# Patient Record
Sex: Female | Born: 2000 | Hispanic: Yes | Marital: Single | Smoking: Never smoker
Health system: Southern US, Community
[De-identification: ages and names within clinical notes are randomized; demographics above are authoritative.]

## PROBLEM LIST (undated history)

## (undated) DIAGNOSIS — S060XAA Concussion with loss of consciousness status unknown, initial encounter: Secondary | ICD-10-CM

## (undated) DIAGNOSIS — F319 Bipolar disorder, unspecified: Secondary | ICD-10-CM

## (undated) DIAGNOSIS — F32A Depression, unspecified: Secondary | ICD-10-CM

## (undated) DIAGNOSIS — F419 Anxiety disorder, unspecified: Secondary | ICD-10-CM

## (undated) DIAGNOSIS — S060X9A Concussion with loss of consciousness of unspecified duration, initial encounter: Secondary | ICD-10-CM

## (undated) DIAGNOSIS — F329 Major depressive disorder, single episode, unspecified: Secondary | ICD-10-CM

## (undated) DIAGNOSIS — J45909 Unspecified asthma, uncomplicated: Secondary | ICD-10-CM

---
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
## 2021-01-02 ENCOUNTER — Telehealth (INDEPENDENT_AMBULATORY_CARE_PROVIDER_SITE_OTHER): Payer: No Typology Code available for payment source | Admitting: Child and Adolescent Psychiatry

## 2021-01-02 ENCOUNTER — Encounter: Payer: Self-pay | Admitting: Child and Adolescent Psychiatry

## 2021-01-02 ENCOUNTER — Other Ambulatory Visit: Payer: Self-pay

## 2021-01-02 DIAGNOSIS — F330 Major depressive disorder, recurrent, mild: Secondary | ICD-10-CM | POA: Diagnosis not present

## 2021-01-02 DIAGNOSIS — G4709 Other insomnia: Secondary | ICD-10-CM

## 2021-01-02 DIAGNOSIS — F418 Other specified anxiety disorders: Secondary | ICD-10-CM

## 2021-01-02 DIAGNOSIS — F422 Mixed obsessional thoughts and acts: Secondary | ICD-10-CM | POA: Diagnosis not present

## 2021-01-02 MED ORDER — HYDROXYZINE HCL 50 MG PO TABS: ORAL_TABLET | ORAL | 1 refills | Status: DC

## 2021-01-02 MED ORDER — MIRTAZAPINE 15 MG PO TABS: 15.0000 mg | ORAL_TABLET | Freq: Every day | ORAL | 1 refills | Status: DC

## 2021-01-02 MED ORDER — FLUOXETINE HCL 10 MG PO CAPS: 10.0000 mg | ORAL_CAPSULE | Freq: Every day | ORAL | 1 refills | Status: DC

## 2021-01-02 MED ORDER — HYDROXYZINE HCL 25 MG PO TABS: ORAL_TABLET | ORAL | 1 refills | Status: DC

## 2021-01-02 MED ORDER — PROPRANOLOL HCL 10 MG PO TABS: ORAL_TABLET | ORAL | 1 refills | Status: DC

## 2021-01-02 MED ORDER — FLUOXETINE HCL 40 MG PO CAPS: 40.0000 mg | ORAL_CAPSULE | Freq: Every day | ORAL | 1 refills | Status: DC

## 2021-01-02 NOTE — Progress Notes (Signed)
Virtual Visit via Video Note  I connected with Gina Rogers on 01/02/21 at  1:00 PM EDT by a video enabled telemedicine application and verified that I am speaking with the correct person using two identifiers.  Location: Patient: home Provider: office   I discussed the limitations of evaluation and management by telemedicine and the availability of in person appointments. The patient expressed understanding and agreed to proceed.    I discussed the assessment and treatment plan with the patient. The patient was provided an opportunity to ask questions and all were answered. The patient agreed with the plan and demonstrated an understanding of the instructions.   The patient was advised to call back or seek an in-person evaluation if the symptoms worsen or if the condition fails to improve as anticipated.  I provided 30 minutes of non-face-to-face time during this encounter.   Gina Smalling, MD     [facility] MD/PA/NP OP Progress Note  01/02/21  1:00 PM Gina Rogers  MRN:  [id number]  Chief Complaint: Medication management follow-up for mood, anxiety, OCD and sleeping difficulties.  HPI: This is a 20 year old female in treatment for anxiety, OCD, sleeping difficulties, mood and also has history of trauma and multiple previous psychiatric hospitalizations.   Gina Rogers was seen and evaluated over telemedicine encounter for medication management follow-up.  She was present by herself and was evaluated alone.  Appointment was also attended by rotating nurse practitioner student with the patient's verbal informed consent.  Gina Rogers reports that she believes she is doing better as compared to last appointment however her mood has remained "low".  She rates her mood around 4.5 out of 10, 10 being the best mood.  She reports that her mood improves as soon as she gets out of her home or hangs out with her friends.  She does report that she lacks motivation, and also has poor appetite.  She reports that she  is sleeping fairly well with Remeron, still struggles with tiredness, denies any suicidal thoughts or homicidal thoughts.  She does report anxiety in the context of overthinking about her future and college outcome.  She reports that she continues to work on paperwork regarding school and will hear back from college on December 16 whether she will be able to be enrolled herself in the college.  She reports that she tries to stay in the present rather than overthinking and that has been helpful with her anxiety. Provided refelctive and empathic listening, and validated patient's experience.  She reports that she does hang out with her friends, with her neighbor and is planning to go to [place] to visit her grandparents.  We discussed the option of increasing the fluoxetine to 60 mg however she would like to wait as she believes that her mood will improve once she will be out of her home.  We discussed to continue with current medications and follow back again in 1 month or earlier if needed.  In regards of marijuana abuse, she reports that she has been smoking less, smokes about 1 g every day.  Writer continued with motivational interviewing and encouraged her to cut down on marijuana use.  She denies any other substances.     Visit Diagnosis:    ICD-10-CM   1. Other specified anxiety disorders  F41.8 FLUoxetine (PROZAC) 10 MG capsule    FLUoxetine (PROZAC) 40 MG capsule    mirtazapine (REMERON) 15 MG tablet    hydrOXYzine (ATARAX/VISTARIL) 50 MG tablet    hydrOXYzine (ATARAX/VISTARIL) 25  MG tablet    propranolol (INDERAL) 10 MG tablet    2. Other insomnia  G47.09     3. Mild episode of recurrent major depressive disorder (HCC)  F33.0     4. Mixed obsessional thoughts and acts  F42.2         Past Psychiatric History: As mentioned in initial H&P, reviewed today, no change   Past Medical History:  Past Medical History:  Diagnosis Date   Anxiety    Asthma    Bipolar 1 disorder  (HCC)    Bipolar and related disorder (HCC) 03/08/2016   Concussion    Depression    No past surgical history on file.  Family Psychiatric History: As mentioned in initial H&P, reviewed today, no change Family History:  Family History  Problem Relation Age of Onset   Anxiety disorder Maternal Uncle    Depression Maternal Uncle     Social History:  Social History   Socioeconomic History   Marital status: Single    Spouse name: Not on file   Number of children: 0   Years of education: Not on file   Highest education level: 11th grade  Occupational History    Comment: part time   Tobacco Use   Smoking status: Never   Smokeless tobacco: Never  Vaping Use   Vaping Use: Some days   Substances: Nicotine  Substance and Sexual Activity   Alcohol use: No   Drug use: Never   Sexual activity: Never  Other Topics Concern   Not on file  Social History Narrative   Not on file   Social Determinants of Health   Financial Resource Strain: Not on file  Food Insecurity: Not on file  Transportation Needs: Not on file  Physical Activity: Not on file  Stress: Not on file  Social Connections: Not on file    Allergies:  Allergies  Allergen Reactions   Adhesive [Tape]    Tapentadol    Ativan [Lorazepam] Other (See Comments)    Mother does not want pt to receive - pt is not allergic.    Metabolic Disorder Labs: No results found for: HGBA1C, MPG No results found for: PROLACTIN No results found for: CHOL, TRIG, HDL, CHOLHDL, VLDL, LDLCALC No results found for: TSH  Therapeutic Level Labs: No results found for: LITHIUM No results found for: VALPROATE No components found for:  CBMZ  Current Medications: Current Outpatient Medications  Medication Sig Dispense Refill   docusate sodium (COLACE) 100 MG capsule Take 2 capsules (200 mg total) by mouth 2 (two) times daily. 120 capsule 0   FLUoxetine (PROZAC) 10 MG capsule Take 1 capsule (10 mg total) by mouth daily. To be combined  with Fluoxetine 40 mg daily. 30 capsule 1   FLUoxetine (PROZAC) 40 MG capsule Take 1 capsule (40 mg total) by mouth daily. 30 capsule 1   hydrOXYzine (ATARAX/VISTARIL) 25 MG tablet TAKE 1 TABLET BY MOUTH EVERY MORNING AND AT NOON 60 tablet 1   hydrOXYzine (ATARAX/VISTARIL) 50 MG tablet TAKE 1 TABLET(50 MG) BY MOUTH AT BEDTIME 30 tablet 1   mirtazapine (REMERON) 15 MG tablet Take 1 tablet (15 mg total) by mouth at bedtime. 30 tablet 1   PROAIR HFA 108 (90 Base) MCG/ACT inhaler   1   propranolol (INDERAL) 10 MG tablet TAKE 1 TABLET(10 MG) BY MOUTH TWICE DAILY 60 tablet 1   RETIN-A 0.1 % cream APP A PEA SIZED AMT TO DRY FACE NIGHTLY  3   witch hazel-glycerin (TUCKS)  pad Apply 1 application topically as needed for itching. 40 each 12   No current facility-administered medications for this visit.     Musculoskeletal:  Gait & Station: unable to assess since visit was over the telemedicine. Patient leans: N/A  Psychiatric Specialty Exam: ROSReview of 12 systems negative except as mentioned in HPI  unknown if currently breastfeeding.There is no height or weight on file to calculate BMI.    Mental Status Exam: Appearance: casually dressed; well groomed; no overt signs of trauma or distress noted Attitude: calm, cooperative with good eye contact Activity: No PMA/PMR, no tics/no tremors; no EPS noted  Speech: normal rate, rhythm and volume Thought Process: Logical, linear, and goal-directed.  Associations: no looseness, tangentiality, circumstantiality, flight of ideas, thought blocking or word salad noted Thought Content: (abnormal/psychotic thoughts): no abnormal or delusional thought process evidenced SI/HI: denies Si/Hi Perception: no illusions or visual/auditory hallucinations noted; no response to internal stimuli demonstrated Mood & Affect: "good"/restricted Judgment & Insight: both fair Attention and Concentration : Good Cognition : WNL Language : Good ADL - Intact    Screenings: AIMS    Flowsheet Row [facility]  AIMS Total Score 0      AUDIT    Flowsheet Row [facility]  Alcohol Use Disorder Identification Test Final Score (AUDIT) 0       Synopsis: - 20 year-old biracial female, 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, previously Archivist at [employer] with medical history significant of bronchial asthma and migraine, and psychiatric history significant of multiple previous psychiatric hospitalizations, previous suicide attempts and carries psychiatric diagnoses of major depressive disorder, anxiety disorders and bipolar disorder referred by her therapist for psychiatric medication management due to transportation problems because pt's last provider was at [facility] in [place] in 2019   Assessment and Plan:   - Pt's presentation appears most consistent with generalized and social anxiety disorder, with recurrent depression. Mother had previously expressed concerns for bipolar disorder, but it appears less likely in the absence of typical manic/hypomanic episodes which are consistent with  bipolar disorder.  - Pt also reports hx most likely consistent with OCD  Update on 11/01 - Reports mild depression and anxiety in the context of current psychosocial stressors, no SI recently, continues to use MJA but reports cutting down. Continues to wait on College's decision regarding re-enrollment. Tolerating meds well and does not want to increase Prozac as she feels the depression would improve once she will be out of mother's home. Expressed concerns regarding ADHD, inattentive type, and recommended psychological eval for ADHD, she would like to hold off now as she is not sure if she will be staying here or go back to [place] or return to college.     Plan: Plan reviewed on  01/02/21  # Mood, Anxiety (chronic, stable), OCD (chronic and stable) - Continue Prozac  50 mg daily.  - Continue with Remeron 15 mg QHS - Continue Atarax 25 mg QAM, 50 mg QHS, and continue atarax 25 mg PRN at noon,  - Continue Propranolol 10 mg BID.    Insomnia: - Continue Remeron 15 mg QHS - Continue Atarax 50 mg QHS    This note was generated in part or whole with voice recognition software. Voice recognition is usually quite accurate but there are transcription errors that can and very often do occur. I apologize for any typographical errors that were  not detected and corrected.   MDM = 2 or more chronic conditions + med management       Gina Smalling, MD 01/02/21  1:27 PM

## 2021-02-01 ENCOUNTER — Other Ambulatory Visit: Payer: Self-pay

## 2021-02-01 ENCOUNTER — Telehealth (INDEPENDENT_AMBULATORY_CARE_PROVIDER_SITE_OTHER): Payer: Medicaid Other | Admitting: Child and Adolescent Psychiatry

## 2021-02-01 DIAGNOSIS — G4709 Other insomnia: Secondary | ICD-10-CM

## 2021-02-01 DIAGNOSIS — F3341 Major depressive disorder, recurrent, in partial remission: Secondary | ICD-10-CM

## 2021-02-01 DIAGNOSIS — F418 Other specified anxiety disorders: Secondary | ICD-10-CM

## 2021-02-01 DIAGNOSIS — F422 Mixed obsessional thoughts and acts: Secondary | ICD-10-CM

## 2021-02-01 NOTE — Progress Notes (Signed)
Virtual Visit via Video Note  I connected with Gina Rogers on 02/01/21 at  1:00 PM EST by a video enabled telemedicine application and verified that I am speaking with the correct person using two identifiers.  Location: Patient: home Provider: office   I discussed the limitations of evaluation and management by telemedicine and the availability of in person appointments. The patient expressed understanding and agreed to proceed.    I discussed the assessment and treatment plan with the patient. The patient was provided an opportunity to ask questions and all were answered. The patient agreed with the plan and demonstrated an understanding of the instructions.   The patient was advised to call back or seek an in-person evaluation if the symptoms worsen or if the condition fails to improve as anticipated.  I provided 30 minutes of non-face-to-face time during this encounter.   Gina Smalling, MD     [facility] MD/PA/NP OP Progress Note  02/01/21  1:00 PM Gina Rogers  MRN:  [id number]  Chief Complaint: Medication management follow-up for mood, anxiety, OCD and sleeping difficulties.  HPI: This is a 20 year old female in treatment for anxiety, OCD, sleeping difficulties, mood and also has history of trauma and multiple previous psychiatric hospitalizations.   Gina Rogers was seen and evaluated over telemedicine encounter for medication management follow-up.  She was present by herself and was evaluated alone.  Gina Rogers reports that she is doing "pretty good".  She reports that her mood has been better, has improved motivation, energy is better and sleeping much better as compared to before.  She denies having any suicidal thoughts or homicidal thoughts recently and her anxiety appears to be much more manageable.  She reports that she also has not been having obsessive thoughts or cleaning rituals.  She reports that she continues to use marijuana about once every other day but less as compared to  before.  She reports that she heard back from her college that her reenrollment was approved by [facility] [employer].  She reports that she is waiting to hear back on her financial aid approval.  She reports that she is looking forward to start college but without financial aid she want to be able to.  We discussed that she call her financial aid office.  She verbalized understanding.  She reports that she has been compliant with her medications and denies any problems with her medications.  She reports that she plans to visit [place] with her neighbor Gina Rogers this weekend and is looking forward to it.  We discussed the transition process of her treatment to her student counseling center.  She reports that she will be starting college in January.  I discussed with her to sign release of information for her psychiatrist at [facility] so that writer can give her the handoff and they can transition the treatment there.  She verbalized understanding.  We will have 1 more appointment prior to her transition to college.  Discussed to continue with current medications.  Visit Diagnosis:    ICD-10-CM   1. Recurrent major depressive disorder, in partial remission (HCC)  F33.41     2. Mixed obsessional thoughts and acts  F42.2     3. Other specified anxiety disorders  F41.8     4. Other insomnia  G47.09          Past Psychiatric History: As mentioned in initial H&P, reviewed today, no change   Past Medical History:  Past Medical History:  Diagnosis Date  Anxiety    Asthma    Bipolar 1 disorder (HCC)    Bipolar and related disorder (HCC) 03/08/2016   Concussion    Depression    No past surgical history on file.  Family Psychiatric History: As mentioned in initial H&P, reviewed today, no change Family History:  Family History  Problem Relation Age of Onset   Anxiety disorder Maternal Uncle    Depression Maternal Uncle     Social History:  Social History   Socioeconomic History    Marital status: Single    Spouse name: Not on file   Number of children: 0   Years of education: Not on file   Highest education level: 11th grade  Occupational History    Comment: part time   Tobacco Use   Smoking status: Never   Smokeless tobacco: Never  Vaping Use   Vaping Use: Some days   Substances: Nicotine  Substance and Sexual Activity   Alcohol use: No   Drug use: Never   Sexual activity: Never  Other Topics Concern   Not on file  Social History Narrative   Not on file   Social Determinants of Health   Financial Resource Strain: Not on file  Food Insecurity: Not on file  Transportation Needs: Not on file  Physical Activity: Not on file  Stress: Not on file  Social Connections: Not on file    Allergies:  Allergies  Allergen Reactions   Adhesive [Tape]    Tapentadol    Ativan [Lorazepam] Other (See Comments)    Mother does not want pt to receive - pt is not allergic.    Metabolic Disorder Labs: No results found for: HGBA1C, MPG No results found for: PROLACTIN No results found for: CHOL, TRIG, HDL, CHOLHDL, VLDL, LDLCALC No results found for: TSH  Therapeutic Level Labs: No results found for: LITHIUM No results found for: VALPROATE No components found for:  CBMZ  Current Medications: Current Outpatient Medications  Medication Sig Dispense Refill   docusate sodium (COLACE) 100 MG capsule Take 2 capsules (200 mg total) by mouth 2 (two) times daily. 120 capsule 0   FLUoxetine (PROZAC) 10 MG capsule Take 1 capsule (10 mg total) by mouth daily. To be combined with Fluoxetine 40 mg daily. 30 capsule 1   FLUoxetine (PROZAC) 40 MG capsule Take 1 capsule (40 mg total) by mouth daily. 30 capsule 1   hydrOXYzine (ATARAX/VISTARIL) 25 MG tablet TAKE 1 TABLET BY MOUTH EVERY MORNING AND AT NOON 60 tablet 1   hydrOXYzine (ATARAX/VISTARIL) 50 MG tablet TAKE 1 TABLET(50 MG) BY MOUTH AT BEDTIME 30 tablet 1   mirtazapine (REMERON) 15 MG tablet Take 1 tablet (15 mg total)  by mouth at bedtime. 30 tablet 1   PROAIR HFA 108 (90 Base) MCG/ACT inhaler   1   propranolol (INDERAL) 10 MG tablet TAKE 1 TABLET(10 MG) BY MOUTH TWICE DAILY 60 tablet 1   RETIN-A 0.1 % cream APP A PEA SIZED AMT TO DRY FACE NIGHTLY  3   witch hazel-glycerin (TUCKS) pad Apply 1 application topically as needed for itching. 40 each 12   No current facility-administered medications for this visit.     Musculoskeletal:  Gait & Station: unable to assess since visit was over the telemedicine. Patient leans: N/A  Psychiatric Specialty Exam: ROSReview of 12 systems negative except as mentioned in HPI  unknown if currently breastfeeding.There is no height or weight on file to calculate BMI.   Mental Status Exam: Appearance: casually dressed;  well groomed; no overt signs of trauma or distress noted Attitude: calm, cooperative with good eye contact Activity: No PMA/PMR, no tics/no tremors; no EPS noted  Speech: normal rate, rhythm and volume Thought Process: Logical, linear, and goal-directed.  Associations: no looseness, tangentiality, circumstantiality, flight of ideas, thought blocking or word salad noted Thought Content: (abnormal/psychotic thoughts): no abnormal or delusional thought process evidenced SI/HI: denies Si/Hi Perception: no illusions or visual/auditory hallucinations noted; no response to internal stimuli demonstrated Mood & Affect: "good"/full range Judgment & Insight: both fair Attention and Concentration : Good Cognition : WNL Language : Good ADL - Intact   Screenings: AIMS    Flowsheet Row [facility]  AIMS Total Score 0      AUDIT    Flowsheet Row Ad[facility]  Alcohol Use Disorder Identification Test Final Score (AUDIT) 0       Synopsis: - 20 year-old biracial female, 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,  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 Mother had previously expressed concerns for bipolar disorder, but it appears less likely in the absence of typical manic/hypomanic episodes which are consistent with  bipolar disorder.  - Pt also reports hx most likely consistent with OCD  Update on 12/01 - Reports improvement in mood and anxiety, no SI recently, is approved to reenrolled sell in the college which appears to have decrease the stress.  Continues to use MJA but reports cutting down.  Previously expressed concerns regarding ADHD, inattentive type, and recommended psychological eval for ADHD, she would like to hold off now as she is not sure if she will be staying here or go back to [place] or return to college.     Plan: Plan reviewed on 02/01/21  # Mood, Anxiety (chronic, stable), OCD (chronic and stable) - Continue Prozac  50 mg daily.  - Continue with Remeron 15 mg QHS - Continue Atarax 25 mg QAM, 50 mg QHS, and continue atarax 25 mg PRN at noon,  - Continue Propranolol 10 mg BID.    Insomnia: - Continue Remeron 15 mg QHS - Continue Atarax 50 mg QHS    This note was generated in part or whole with voice recognition software. Voice recognition is usually quite accurate but there are transcription errors that can and very often do occur. I apologize for any typographical errors that were  not detected and corrected.   MDM = 2 or more chronic stable conditions + med management       Gina Smalling, MD 02/01/21  1:26 PM

## 2021-07-31 ENCOUNTER — Telehealth: Payer: Self-pay | Admitting: Child and Adolescent Psychiatry

## 2021-07-31 ENCOUNTER — Telehealth (INDEPENDENT_AMBULATORY_CARE_PROVIDER_SITE_OTHER): Payer: Medicaid Other | Admitting: Child and Adolescent Psychiatry

## 2021-07-31 DIAGNOSIS — F3341 Major depressive disorder, recurrent, in partial remission: Secondary | ICD-10-CM | POA: Diagnosis not present

## 2021-07-31 DIAGNOSIS — F422 Mixed obsessional thoughts and acts: Secondary | ICD-10-CM | POA: Diagnosis not present

## 2021-07-31 DIAGNOSIS — F418 Other specified anxiety disorders: Secondary | ICD-10-CM | POA: Diagnosis not present

## 2021-07-31 MED ORDER — FLUOXETINE HCL 10 MG PO CAPS: 10.0000 mg | ORAL_CAPSULE | Freq: Every day | ORAL | 1 refills | Status: DC

## 2021-07-31 MED ORDER — HYDROXYZINE HCL 25 MG PO TABS: ORAL_TABLET | ORAL | 1 refills | Status: DC

## 2021-07-31 MED ORDER — FLUOXETINE HCL 40 MG PO CAPS: 40.0000 mg | ORAL_CAPSULE | Freq: Every day | ORAL | 1 refills | Status: DC

## 2021-07-31 MED ORDER — MIRTAZAPINE 15 MG PO TABS: 15.0000 mg | ORAL_TABLET | Freq: Every day | ORAL | 1 refills | Status: DC

## 2021-07-31 MED ORDER — HYDROXYZINE HCL 50 MG PO TABS: ORAL_TABLET | ORAL | 1 refills | Status: DC

## 2021-07-31 MED ORDER — PROPRANOLOL HCL 10 MG PO TABS: ORAL_TABLET | ORAL | 1 refills | Status: DC

## 2021-07-31 NOTE — Progress Notes (Signed)
Virtual Visit via Video Note  I connected with Winferd HumphreyLaila Wold on 07/31/21 at  1:15 PM EDT by a video enabled telemedicine application and verified that I am speaking with the correct person using two identifiers.  Location: Patient: home Provider: office   I discussed the limitations of evaluation and management by telemedicine and the availability of in person appointments. The patient expressed understanding and agreed to proceed.    I discussed the assessment and treatment plan with the patient. The patient was provided an opportunity to ask questions and all were answered. The patient agreed with the plan and demonstrated an understanding of the instructions.   The patient was advised to call back or seek an in-person evaluation if the symptoms worsen or if the condition fails to improve as anticipated.  I provided 30 minutes of non-face-to-face time during this encounter.   Darcel SmallingHiren M Alwaleed Obeso, MD     [facility] MD/PA/NP OP Progress Note  07/31/21  1:00 PM Winferd HumphreyLaila Schunk  MRN:  [id number]  Chief Complaint: Medication management follow-up for mood, anxiety, OCD and sleeping difficulties.  HPI: This is a 21 year old female in treatment for anxiety, OCD, sleeping difficulties, mood and also has history of trauma and multiple previous psychiatric hospitalizations.   Donovan KailLaila was seen and evaluated over telemedicine encounter for medication management follow-up.  She was present by herself and was evaluated alone.  Visit Diagnosis:    ICD-10-CM   1. Recurrent major depressive disorder, in partial remission (HCC)  F33.41     2. Other specified anxiety disorders  F41.8 FLUoxetine (PROZAC) 10 MG capsule    FLUoxetine (PROZAC) 40 MG capsule    mirtazapine (REMERON) 15 MG tablet    hydrOXYzine (ATARAX) 25 MG tablet    hydrOXYzine (ATARAX) 50 MG tablet    propranolol (INDERAL) 10 MG tablet    3. Mixed obsessional thoughts and acts  F42.2          Past Psychiatric History: As mentioned in  initial H&P, reviewed today, no change   Past Medical History:  Past Medical History:  Diagnosis Date   Anxiety    Asthma    Bipolar 1 disorder (HCC)    Bipolar and related disorder (HCC) 03/08/2016   Concussion    Depression    No past surgical history on file.  Family Psychiatric History: As mentioned in initial H&P, reviewed today, no change Family History:  Family History  Problem Relation Age of Onset   Anxiety disorder Maternal Uncle    Depression Maternal Uncle     Social History:  Social History   Socioeconomic History   Marital status: Single    Spouse name: Not on file   Number of children: 0   Years of education: Not on file   Highest education level: 11th grade  Occupational History    Comment: part time   Tobacco Use   Smoking status: Never   Smokeless tobacco: Never  Vaping Use   Vaping Use: Some days   Substances: Nicotine  Substance and Sexual Activity   Alcohol use: No   Drug use: Never   Sexual activity: Never  Other Topics Concern   Not on file  Social History Narrative   Not on file   Social Determinants of Health   Financial Resource Strain: Not on file  Food Insecurity: Not on file  Transportation Needs: Not on file  Physical Activity: Not on file  Stress: Not on file  Social Connections: Not on file    Allergies:  Allergies  Allergen Reactions   Adhesive [Tape]    Tapentadol    Ativan [Lorazepam] Other (See Comments)    Mother does not want pt to receive - pt is not allergic.    Metabolic Disorder Labs: No results found for: HGBA1C, MPG No results found for: PROLACTIN No results found for: CHOL, TRIG, HDL, CHOLHDL, VLDL, LDLCALC No results found for: TSH  Therapeutic Level Labs: No results found for: LITHIUM No results found for: VALPROATE No components found for:  CBMZ  Current Medications: Current Outpatient Medications  Medication Sig Dispense Refill   docusate sodium (COLACE) 100 MG capsule Take 2 capsules (200  mg total) by mouth 2 (two) times daily. 120 capsule 0   FLUoxetine (PROZAC) 10 MG capsule Take 1 capsule (10 mg total) by mouth daily. To be combined with Fluoxetine 40 mg daily. 30 capsule 1   FLUoxetine (PROZAC) 40 MG capsule Take 1 capsule (40 mg total) by mouth daily. 30 capsule 1   hydrOXYzine (ATARAX) 25 MG tablet TAKE 1 TABLET BY MOUTH EVERY MORNING AND AT NOON 60 tablet 1   hydrOXYzine (ATARAX) 50 MG tablet TAKE 1 TABLET(50 MG) BY MOUTH AT BEDTIME 30 tablet 1   mirtazapine (REMERON) 15 MG tablet Take 1 tablet (15 mg total) by mouth at bedtime. 30 tablet 1   PROAIR HFA 108 (90 Base) MCG/ACT inhaler   1   propranolol (INDERAL) 10 MG tablet TAKE 1 TABLET(10 MG) BY MOUTH TWICE DAILY 60 tablet 1   RETIN-A 0.1 % cream APP A PEA SIZED AMT TO DRY FACE NIGHTLY  3   witch hazel-glycerin (TUCKS) pad Apply 1 application topically as needed for itching. 40 each 12   No current facility-administered medications for this visit.     Musculoskeletal:  Gait & Station: unable to assess since visit was over the telemedicine. Patient leans: N/A  Psychiatric Specialty Exam: ROSReview of 12 systems negative except as mentioned in HPI  unknown if currently breastfeeding.There is no height or weight on file to calculate BMI.   Mental Status Exam: Appearance: casually dressed; well groomed; no overt signs of trauma or distress noted Attitude: calm, cooperative with good eye contact Activity: No PMA/PMR, no tics/no tremors; no EPS noted  Speech: normal rate, rhythm and volume Thought Process: Logical, linear, and goal-directed.  Associations: no looseness, tangentiality, circumstantiality, flight of ideas, thought blocking or word salad noted Thought Content: (abnormal/psychotic thoughts): no abnormal or delusional thought process evidenced SI/HI: denies Si/Hi Perception: no illusions or visual/auditory hallucinations noted; no response to internal stimuli demonstrated Mood & Affect: "good"/full  range Judgment & Insight: both fair Attention and Concentration : Good Cognition : WNL Language : Good ADL - Intact   Screenings: AIMS    [facility]  AIMS Total Score 0      AUDIT    Flowsheet Row [facility]  Alcohol Use Disorder Identification Test Final Score (AUDIT) 0       Synopsis: - 21 year-old biracial female, 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, previously Archivist at [employer] with medical history significant of bronchial asthma and migraine, and psychiatric history significant of multiple previous psychiatric hospitalizations, previous suicide attempts and carries psychiatric diagnoses of major depressive disorder, anxiety disorders and bipolar disorder referred by her therapist for psychiatric medication management due to transportation problems because pt's last provider was at [facility] in [place]  in 2019   Assessment and Plan:   - Pt's presentation appears most consistent with generalized and social anxiety disorder, with recurrent depression. Mother had previously expressed concerns for bipolar disorder, but it appears less likely in the absence of typical manic/hypomanic episodes which are consistent with  bipolar disorder.  - Pt also reports hx most likely consistent with OCD  Update on 12/01 - Reports improvement in mood and anxiety, no SI recently, is approved to reenrolled sell in the college which appears to have decrease the stress.  Continues to use MJA but reports cutting down.  Previously expressed concerns regarding ADHD, inattentive type, and recommended psychological eval for ADHD, she would like to hold off now as she is not sure if she will be staying here or go back to [place] or return to college.     Plan: Plan reviewed on 02/01/21  # Mood, Anxiety  (chronic, stable), OCD (chronic and stable) - Continue Prozac  50 mg daily.  - Continue with Remeron 15 mg QHS - Continue Atarax 25 mg QAM, 50 mg QHS, and continue atarax 25 mg PRN at noon,  - Continue Propranolol 10 mg BID.    Insomnia: - Continue Remeron 15 mg QHS - Continue Atarax 50 mg QHS    This note was generated in part or whole with voice recognition software. Voice recognition is usually quite accurate but there are transcription errors that can and very often do occur. I apologize for any typographical errors that were not detected and corrected.   MDM = 2 or more chronic stable conditions + med management       Darcel Smalling, MD 07/31/21  5:24 PM

## 2021-08-28 ENCOUNTER — Telehealth (INDEPENDENT_AMBULATORY_CARE_PROVIDER_SITE_OTHER): Payer: Medicaid Other | Admitting: Child and Adolescent Psychiatry

## 2021-08-28 DIAGNOSIS — G4709 Other insomnia: Secondary | ICD-10-CM

## 2021-08-28 DIAGNOSIS — F422 Mixed obsessional thoughts and acts: Secondary | ICD-10-CM | POA: Diagnosis not present

## 2021-08-28 DIAGNOSIS — F418 Other specified anxiety disorders: Secondary | ICD-10-CM

## 2021-08-28 DIAGNOSIS — F3341 Major depressive disorder, recurrent, in partial remission: Secondary | ICD-10-CM | POA: Diagnosis not present

## 2021-08-28 NOTE — Progress Notes (Signed)
Virtual Visit via Video Note  I connected with Gina Rogers on 08/28/21 at  2:00 PM EDT by a video enabled telemedicine application and verified that I am speaking with the correct person using two identifiers.  Location: Patient: home Provider: office   I discussed the limitations of evaluation and management by telemedicine and the availability of in person appointments. The patient expressed understanding and agreed to proceed.    I discussed the assessment and treatment plan with the patient. The patient was provided an opportunity to ask questions and all were answered. The patient agreed with the plan and demonstrated an understanding of the instructions.   The patient was advised to call back or seek an in-person evaluation if the symptoms worsen or if the condition fails to improve as anticipated.  I provided 30 minutes of non-face-to-face time during this encounter.   Darcel Smalling, MD     [facility] MD/PA/NP OP Progress Note  08/28/21  2:00 PM Gina Rogers  MRN:  [id number]  Chief Complaint: Medication management follow-up for mood, anxiety, OCD, sleeping difficulties.   HPI: This is a 21 year old female in treatment for anxiety, OCD, sleeping difficulties, mood and also has history of trauma and multiple previous psychiatric hospitalizations.   Gina Rogers was seen and evaluated over telemedicine encounter for medication management follow-up.  She was present by herself and was evaluated jointly.  She was last prescribed fluoxetine 50 mg once a day, Remeron 15 mg once a day, propranolol 10 mg twice a day, hydroxyzine 25 mg in the morning and 50 mg at night.  Today she reports that she is doing "pretty good" however in the last month she had struggled with low moods in the context of death of one of her aunt in [place].  She reports that she had positive support from her neighbor, her friends and she also use her positive coping skills such as baking which helped her get through this.  She reports that she has been spending most of her time with her neighbor, trying to find a job and her neighbor is helping her with that. She is travelling to [place] and then [place] this weekend and will return back in [facility] July. She reports that she is excited about her trip.   She is still waiting to hear back regarding her retroactive withdrawal for her first year class, is planning to submit letter she wrote for self, her therapist and also requesting a letter from this Clinical research associate. She however reports that she is not pressuring her self to return back to [facility] as she owes about 22K to them as she lost her financial aid last year. She has explored other alternatives which she will work on if she is not able to return back to [facility].   Overall she reports that her mood and anxiety is stable, denies any new concerns for today's appointment, sleeps fairly well and appetite is also fair. Denies any SI/HI. She reports compliance to her medications. Has one refill on prescriptions therefore prescriptions were not sent in today. She continues to use Cannabis and provided psychoeducation on cutting down on it. She will return back to follow up in about 6 weeks or early if needed.    Visit Diagnosis:    ICD-10-CM   1. Recurrent major depressive disorder, in partial remission (HCC)  F33.41     2. Other specified anxiety disorders  F41.8     3. Other insomnia  G47.09     4.  Mixed obsessional thoughts and acts  F42.2          Past Psychiatric History: As mentioned in initial H&P, reviewed today, no change   Past Medical History:  Past Medical History:  Diagnosis Date   Anxiety    Asthma    Bipolar 1 disorder (HCC)    Bipolar and related disorder (HCC) 03/08/2016   Concussion    Depression    No past surgical history on file.  Family Psychiatric History: As mentioned in initial H&P, reviewed today, no change Family History:  Family History  Problem Relation Age of Onset   Anxiety disorder  Maternal Uncle    Depression Maternal Uncle     Social History:  Social History   Socioeconomic History   Marital status: Single    Spouse name: Not on file   Number of children: 0   Years of education: Not on file   Highest education level: 11th grade  Occupational History    Comment: part time   Tobacco Use   Smoking status: Never   Smokeless tobacco: Never  Vaping Use   Vaping Use: Some days   Substances: Nicotine  Substance and Sexual Activity   Alcohol use: No   Drug use: Never   Sexual activity: Never  Other Topics Concern   Not on file  Social History Narrative   Not on file   Social Determinants of Health   Financial Resource Strain: Low Risk  (01/22/2018)   Overall Financial Resource Strain (CARDIA)    Difficulty of Paying Living Expenses: Not hard at all  Food Insecurity: No Food Insecurity (01/22/2018)   Hunger Vital Sign    Worried About Running Out of Food in the Last Year: Never true    Ran Out of Food in the Last Year: Never true  Transportation Needs: No Transportation Needs (01/22/2018)   PRAPARE - Administrator, Civil Service (Medical): No    Lack of Transportation (Non-Medical): No  Physical Activity: Sufficiently Active (01/22/2018)   Exercise Vital Sign    Days of Exercise per Week: 5 days    Minutes of Exercise per Session: 50 min  Stress: Stress Concern Present (01/22/2018)   [employer] of Occupational Health - Occupational Stress Questionnaire    Feeling of Stress : Very much  Social Connections: Unknown (01/22/2018)   Social Connection and Isolation Panel [NHANES]    Frequency of Communication with Friends and Family: Not on file    Frequency of Social Gatherings with Friends and Family: Not on file    Attends Religious Services: Never    Active Member of Clubs or Organizations: No    Attends Banker Meetings: Never    Marital Status: Not on file    Allergies:  Allergies  Allergen Reactions    Adhesive [Tape]    Tapentadol    Ativan [Lorazepam] Other (See Comments)    Mother does not want pt to receive - pt is not allergic.    Metabolic Disorder Labs: No results found for: "HGBA1C", "MPG" No results found for: "PROLACTIN" No results found for: "CHOL", "TRIG", "HDL", "CHOLHDL", "VLDL", "LDLCALC" No results found for: "TSH"  Therapeutic Level Labs: No results found for: "LITHIUM" No results found for: "VALPROATE" No results found for: "CBMZ"  Current Medications: Current Outpatient Medications  Medication Sig Dispense Refill   docusate sodium (COLACE) 100 MG capsule Take 2 capsules (200 mg total) by mouth 2 (two) times daily. 120 capsule 0   FLUoxetine (PROZAC)  10 MG capsule Take 1 capsule (10 mg total) by mouth daily. To be combined with Fluoxetine 40 mg daily. 30 capsule 1   FLUoxetine (PROZAC) 40 MG capsule Take 1 capsule (40 mg total) by mouth daily. 30 capsule 1   hydrOXYzine (ATARAX) 25 MG tablet TAKE 1 TABLET BY MOUTH EVERY MORNING AND AT NOON 60 tablet 1   hydrOXYzine (ATARAX) 50 MG tablet TAKE 1 TABLET(50 MG) BY MOUTH AT BEDTIME 30 tablet 1   mirtazapine (REMERON) 15 MG tablet Take 1 tablet (15 mg total) by mouth at bedtime. 30 tablet 1   PROAIR HFA 108 (90 Base) MCG/ACT inhaler   1   propranolol (INDERAL) 10 MG tablet TAKE 1 TABLET(10 MG) BY MOUTH TWICE DAILY 60 tablet 1   RETIN-A 0.1 % cream APP A PEA SIZED AMT TO DRY FACE NIGHTLY  3   witch hazel-glycerin (TUCKS) pad Apply 1 application topically as needed for itching. 40 each 12   No current facility-administered medications for this visit.     Musculoskeletal:  Gait & Station: unable to assess since visit was over the telemedicine. Patient leans: N/A  Psychiatric Specialty Exam: ROSReview of 12 systems negative except as mentioned in HPI  unknown if currently breastfeeding.There is no height or weight on file to calculate BMI.   Mental Status Exam: Appearance: casually dressed; well groomed; no  overt signs of trauma or distress noted Attitude: calm, cooperative with good eye contact Activity: No PMA/PMR, no tics/no tremors; no EPS noted  Speech: normal rate, rhythm and volume Thought Process: Logical, linear, and goal-directed.  Associations: no looseness, tangentiality, circumstantiality, flight of ideas, thought blocking or word salad noted Thought Content: (abnormal/psychotic thoughts): no abnormal or delusional thought process evidenced SI/HI: denies Si/Hi Perception: no illusions or visual/auditory hallucinations noted; no response to internal stimuli demonstrated Mood & Affect: "good"/full range Judgment & Insight: both fair Attention and Concentration : Good Cognition : WNL Language : Good ADL - Intact   Screenings: AIMS    Flowsheet Row [facility]  AIMS Total Score 0      AUDIT    Flowsheet Row Ad[facility]  Alcohol Use Disorder Identification Test Final Score (AUDIT) 0       Synopsis: - 21 year-old biracial female, 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, 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 Mother had previously expressed concerns for bipolar disorder, but it appears less likely in the absence of typical  manic/hypomanic episodes which are consistent with  bipolar disorder.  - Pt also reports hx most likely consistent with OCD  Update on 06/28- Returns to clinic for 1 month follow up - appears to have some increase in depressive mood in the context of aunt's death in [place], seems to be managing her self well with all the external support from her neighbor and friends and using positive coping. Mood seems better lately and anxiety seems stable, still uncertainty regarding her ability  to return to [facility]. Continuing current meds. Continues to use Cannabis.      Plan: Plan reviewed on  08/29/21  # Mood, Anxiety (chronic, stable), OCD (chronic and stable) - Continue Prozac  50 mg daily.  - Continue with Remeron 15 mg QHS - Continue Atarax 25 mg QAM, 50 mg QHS, and continue atarax 25 mg PRN at noon,  - Continue Propranolol 10 mg BID.    Insomnia: - Continue Remeron 15 mg QHS - Continue Atarax 50 mg QHS    This note was generated in part or whole with voice recognition software. Voice recognition is usually quite accurate but there are transcription errors that can and very often do occur. I apologize for any typographical errors that were not detected and corrected.   MDM = 2 or more chronic stable conditions + med management       Darcel Smalling, MD 08/29/21  8:40 AM

## 2021-10-09 ENCOUNTER — Telehealth: Payer: Medicaid Other | Admitting: Child and Adolescent Psychiatry

## 2021-10-11 ENCOUNTER — Telehealth (INDEPENDENT_AMBULATORY_CARE_PROVIDER_SITE_OTHER): Payer: Medicaid Other | Admitting: Child and Adolescent Psychiatry

## 2021-10-11 DIAGNOSIS — G4709 Other insomnia: Secondary | ICD-10-CM | POA: Diagnosis not present

## 2021-10-11 DIAGNOSIS — F3341 Major depressive disorder, recurrent, in partial remission: Secondary | ICD-10-CM | POA: Diagnosis not present

## 2021-10-11 DIAGNOSIS — F418 Other specified anxiety disorders: Secondary | ICD-10-CM

## 2021-10-11 DIAGNOSIS — F422 Mixed obsessional thoughts and acts: Secondary | ICD-10-CM | POA: Diagnosis not present

## 2021-10-11 MED ORDER — HYDROXYZINE HCL 50 MG PO TABS: ORAL_TABLET | ORAL | 1 refills | Status: DC

## 2021-10-11 MED ORDER — FLUOXETINE HCL 20 MG PO CAPS: 20.0000 mg | ORAL_CAPSULE | Freq: Every day | ORAL | 1 refills | Status: DC

## 2021-10-11 MED ORDER — FLUOXETINE HCL 40 MG PO CAPS: 40.0000 mg | ORAL_CAPSULE | Freq: Every day | ORAL | 1 refills | Status: DC

## 2021-10-11 MED ORDER — MIRTAZAPINE 15 MG PO TABS: 15.0000 mg | ORAL_TABLET | Freq: Every day | ORAL | 1 refills | Status: DC

## 2021-10-11 MED ORDER — PROPRANOLOL HCL 10 MG PO TABS: ORAL_TABLET | ORAL | 1 refills | Status: DC

## 2021-10-11 MED ORDER — HYDROXYZINE HCL 25 MG PO TABS: ORAL_TABLET | ORAL | 1 refills | Status: DC

## 2021-10-11 NOTE — Progress Notes (Signed)
Virtual Visit via Video Note  I connected with [name] on 10/11/21 at  4:00 PM EDT by a video enabled telemedicine application and verified that I am speaking with the correct person using two identifiers.  Location: Patient: home Provider: office   I discussed the limitations of evaluation and management by telemedicine and the availability of in person appointments. The patient expressed understanding and agreed to proceed.    I discussed the assessment and treatment plan with the patient. The patient was provided an opportunity to ask questions and all were answered. The patient agreed with the plan and demonstrated an understanding of the instructions.   The patient was advised to call back or seek an in-person evaluation if the symptoms worsen or if the condition fails to improve as anticipated.  I provided 20 minutes of non-face-to-face time during this encounter.   Darcel Smalling, MD     [facility] MD/PA/NP OP Progress Note 10/11/21  2:00 PM Shruti Arrey  MRN:  [id number]  Chief Complaint: Medication management follow-up for mood, anxiety, OCD, sleeping difficulties.  HPI: This is a 21 year old female in treatment for anxiety, OCD, sleeping difficulties, mood and also has history of trauma and multiple previous psychiatric hospitalizations.   Phoenyx was seen and evaluated over telemedicine encounter for medication management follow-up.  She was present by herself at her home and was evaluated alone. Appointment was attended by clinical observer Lovell Sheehan with pt and parent's verbal informed consent to allow her to attend the appointment.   She reports that she recently came back from a trip to [place], [place] in [place].  She reports that she is still a lot of friends and family.  She reports that it was good to see all the friends and family however she got exhausted at the end, needed some time for herself on her return at home.  She reports that overall she has been doing  well, her mood was becoming depressed for 3 to 4 days when she was in [place] but then it improved.  She reports that overall she is doing well with her mood, denies any low lows or depressed mood, denies anhedonia, sleeps fairly well.  Denies any SI or HI.  She reports that she has been noticing more anxiety lately, especially since she has returned back home, more anxiety when she is with her mother at home and therefore she tries to do things with her friends and her neighbor.  She reports that this helps.  She reports that her anxiety is excessive.  We discussed to consider increasing the dose of fluoxetine to 60 mg once a day, she agreed to this.  We also discussed that this would also help with OCD which also worsened when she was in [place], continues to have checking and other rituals.  She reports that she did not use marijuana as much when she was staying with her grandparents and has been smoking less frequently, smokes about less than 1 g a day.  She reports that she did drink with her friends, had less than 4 drinks whenever she was with her friends and drinking.  She reports that she has been mostly compliant with her medications but did not take Remeron on the days when she was drinking.  She reports that now she is planning to stay in [place], find a job, work and then decide on future course of action on regards of her education.  We also discussed to contact family  solutions again to establish outpatient therapy.  She verbalized understanding.   Visit Diagnosis:    ICD-10-CM   1. Recurrent major depressive disorder, in partial remission (HCC)  F33.41     2. Other specified anxiety disorders  F41.8 FLUoxetine (PROZAC) 40 MG capsule    FLUoxetine (PROZAC) 20 MG capsule    hydrOXYzine (ATARAX) 25 MG tablet    hydrOXYzine (ATARAX) 50 MG tablet    mirtazapine (REMERON) 15 MG tablet    propranolol (INDERAL) 10 MG tablet    3. Mixed obsessional thoughts and acts  F42.2      4. Other insomnia  G47.09          Past Psychiatric History: As mentioned in initial H&P, reviewed today, no change   Past Medical History:  Past Medical History:  Diagnosis Date   Anxiety    Asthma    Bipolar 1 disorder (HCC)    Bipolar and related disorder (HCC) 03/08/2016   Concussion    Depression    No past surgical history on file.  Family Psychiatric History: As mentioned in initial H&P, reviewed today, no change Family History:  Family History  Problem Relation Age of Onset   Anxiety disorder Maternal Uncle    Depression Maternal Uncle     Social History:  Social History   Socioeconomic History   Marital status: Single    Spouse name: Not on file   Number of children: 0   Years of education: Not on file   Highest education level: 11th grade  Occupational History    Comment: part time   Tobacco Use   Smoking status: Never   Smokeless tobacco: Never  Vaping Use   Vaping Use: Some days   Substances: Nicotine  Substance and Sexual Activity   Alcohol use: No   Drug use: Never   Sexual activity: Never  Other Topics Concern   Not on file  Social History Narrative   Not on file   Social Determinants of Health   Financial Resource Strain: Low Risk  (01/22/2018)   Overall Financial Resource Strain (CARDIA)    Difficulty of Paying Living Expenses: Not hard at all  Food Insecurity: No Food Insecurity (01/22/2018)   Hunger Vital Sign    Worried About Running Out of Food in the Last Year: Never true    Ran Out of Food in the Last Year: Never true  Transportation Needs: No Transportation Needs (01/22/2018)   PRAPARE - Administrator, Civil Service (Medical): No    Lack of Transportation (Non-Medical): No  Physical Activity: Sufficiently Active (01/22/2018)   Exercise Vital Sign    Days of Exercise per Week: 5 days    Minutes of Exercise per Session: 50 min  Stress: Stress Concern Present (01/22/2018)   [employer] of Occupational  Health - Occupational Stress Questionnaire    Feeling of Stress : Very much  Social Connections: Unknown (01/22/2018)   Social Connection and Isolation Panel [NHANES]    Frequency of Communication with Friends and Family: Not on file    Frequency of Social Gatherings with Friends and Family: Not on file    Attends Religious Services: Never    Active Member of Clubs or Organizations: No    Attends Banker Meetings: Never    Marital Status: Not on file    Allergies:  Allergies  Allergen Reactions   Adhesive [Tape]    Tapentadol    Ativan [Lorazepam] Other (See Comments)    Mother  does not want pt to receive - pt is not allergic.    Metabolic Disorder Labs: No results found for: "HGBA1C", "MPG" No results found for: "PROLACTIN" No results found for: "CHOL", "TRIG", "HDL", "CHOLHDL", "VLDL", "LDLCALC" No results found for: "TSH"  Therapeutic Level Labs: No results found for: "LITHIUM" No results found for: "VALPROATE" No results found for: "CBMZ"  Current Medications: Current Outpatient Medications  Medication Sig Dispense Refill   docusate sodium (COLACE) 100 MG capsule Take 2 capsules (200 mg total) by mouth 2 (two) times daily. 120 capsule 0   FLUoxetine (PROZAC) 20 MG capsule Take 1 capsule (20 mg total) by mouth daily. To be combined with Fluoxetine 40 mg daily. 30 capsule 1   FLUoxetine (PROZAC) 40 MG capsule Take 1 capsule (40 mg total) by mouth daily. 30 capsule 1   hydrOXYzine (ATARAX) 25 MG tablet TAKE 1 TABLET BY MOUTH EVERY MORNING AND AT NOON 60 tablet 1   hydrOXYzine (ATARAX) 50 MG tablet TAKE 1 TABLET(50 MG) BY MOUTH AT BEDTIME 30 tablet 1   mirtazapine (REMERON) 15 MG tablet Take 1 tablet (15 mg total) by mouth at bedtime. 30 tablet 1   PROAIR HFA 108 (90 Base) MCG/ACT inhaler   1   propranolol (INDERAL) 10 MG tablet TAKE 1 TABLET(10 MG) BY MOUTH TWICE DAILY 60 tablet 1   RETIN-A 0.1 % cream APP A PEA SIZED AMT TO DRY FACE NIGHTLY  3   witch  hazel-glycerin (TUCKS) pad Apply 1 application topically as needed for itching. 40 each 12   No current facility-administered medications for this visit.     Musculoskeletal:  Gait & Station: unable to assess since visit was over the telemedicine. Patient leans: N/A  Psychiatric Specialty Exam: ROSReview of 12 systems negative except as mentioned in HPI  unknown if currently breastfeeding.There is no height or weight on file to calculate BMI.   Mental Status Exam: Appearance: casually dressed; well groomed; no overt signs of trauma or distress noted Attitude: calm, cooperative with good eye contact Activity: No PMA/PMR, no tics/no tremors; no EPS noted  Speech: normal rate, rhythm and volume Thought Process: Logical, linear, and goal-directed.  Associations: no looseness, tangentiality, circumstantiality, flight of ideas, thought blocking or word salad noted Thought Content: (abnormal/psychotic thoughts): no abnormal or delusional thought process evidenced SI/HI: denies Si/Hi Perception: no illusions or visual/auditory hallucinations noted; no response to internal stimuli demonstrated Mood & Affect: "good"/full range, neutral Judgment & Insight: both fair Attention and Concentration : Good Cognition : WNL Language : Good ADL - Intact   Screenings: AIMS    Flowsheet Row [facility]  AIMS Total Score 0      AUDIT    Flowsheet Row Ad[facility]  Alcohol Use Disorder Identification Test Final Score (AUDIT) 0       Synopsis: - 21 year-old biracial female, 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, previously Archivist at [employer] with medical history significant of bronchial asthma and migraine, and psychiatric history significant of multiple previous psychiatric hospitalizations, previous suicide attempts and  carries psychiatric diagnoses of major depressive disorder, anxiety disorders and bipolar disorder referred by her therapist for psychiatric medication management due to transportation problems because pt's last provider was at [facility] in [place] in 2019   Assessment and Plan:   - Pt's presentation appears most consistent with generalized and social anxiety disorder, with recurrent depression.  Mother had previously expressed concerns for bipolar disorder, but it appears less likely in the absence of typical manic/hypomanic episodes which are consistent with  bipolar disorder.  - Pt also reports hx most likely consistent with OCD  Update on 10/11/21 - Returns to clinic for 1.5 month follow up - Appears to have overall stability with mood.  Anxiety and OCD remains worse, recommending to increase the dose to 60 mg daily, continues to use Cannabis and intermittently drinks alcohol, psychoeducation was provided to cut down on use.     Plan: Plan reviewed on  10/11/21  # Mood, Anxiety (chronic, stable), OCD (chronic and stable) - Increase Prozac 60 mg daily.  - Continue with Remeron 15 mg QHS - Continue Atarax 25 mg QAM, 50 mg QHS, and continue atarax 25 mg PRN at noon,  - Continue Propranolol 10 mg BID.   Insomnia: - Continue Remeron 15 mg QHS - Continue Atarax 50 mg QHS    This note was generated in part or whole with voice recognition software. Voice recognition is usually quite accurate but there are transcription errors that can and very often do occur. I apologize for any typographical errors that were not detected and corrected.   MDM = 2 or more chronic stable conditions + med management       Darcel Smalling, MD 10/11/21  4:28 PM

## 2021-10-26 NOTE — Telephone Encounter (Signed)
My Chart message sent

## 2021-11-07 IMAGING — CT CT HEAD W/O CM
4 series · 16 of 47 positions shown, 18 images · non-contrast
Comparison: None.

CLINICAL DATA: Headache, uncomplicated (Ped 0-18y). Patient reports
striking her head 6 days ago.

EXAM:
CT HEAD WITHOUT CONTRAST
TECHNIQUE: Contiguous axial images were obtained from the base of the skull
through the vertex without intravenous contrast.

[Series 2: head bone · axial · 0.39mm/px · z∈[-67,-39]mm · 3 of 68 slices shown]
[im 7/68  bone]
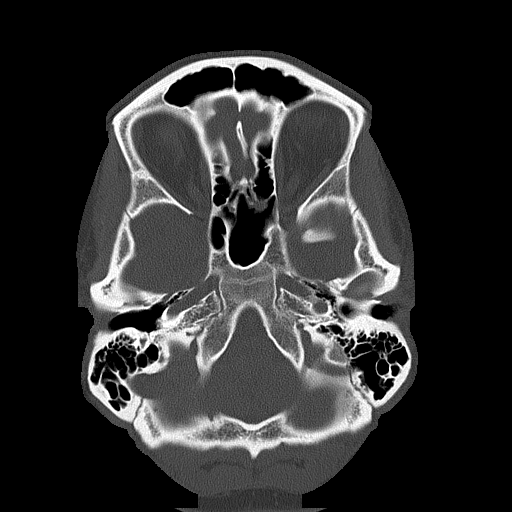
[im 14/68  bone]
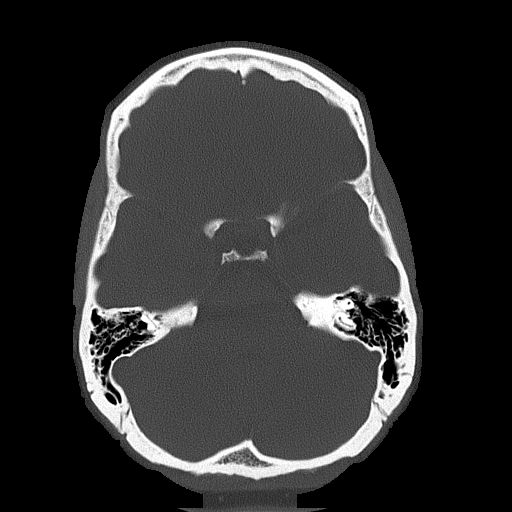
[im 21/68  bone]
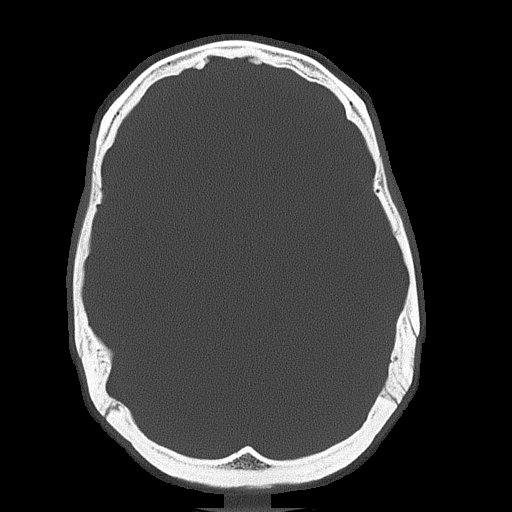

[Series 3: head wo · axial · 0.39mm/px · z∈[-64,+36]mm · 7 of 28 slices shown, 9 images]
[im 4/28  brain]
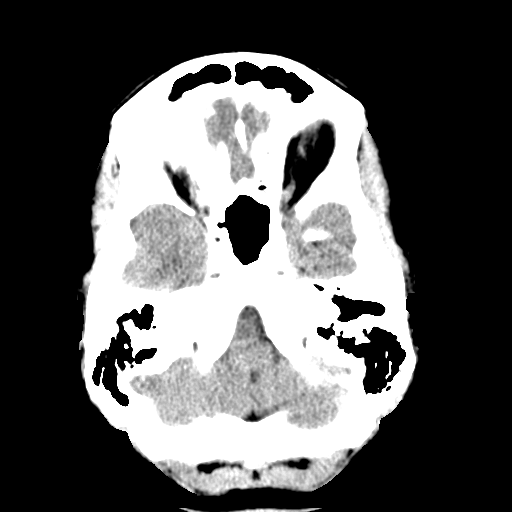
[im 4/28  bone]
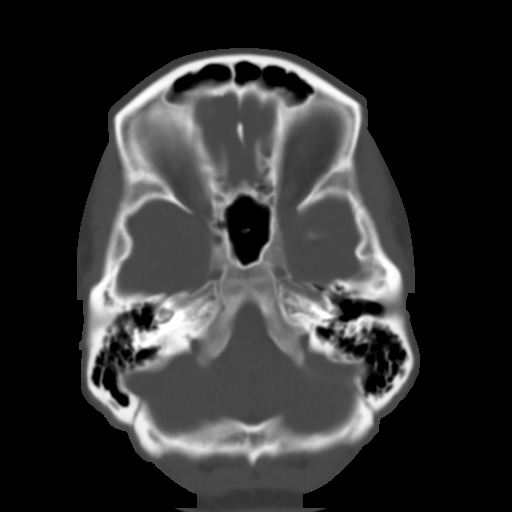
[im 7/28  brain]
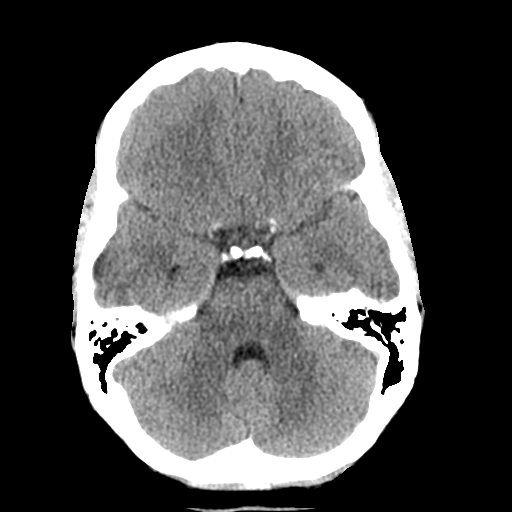
[im 11/28  brain]
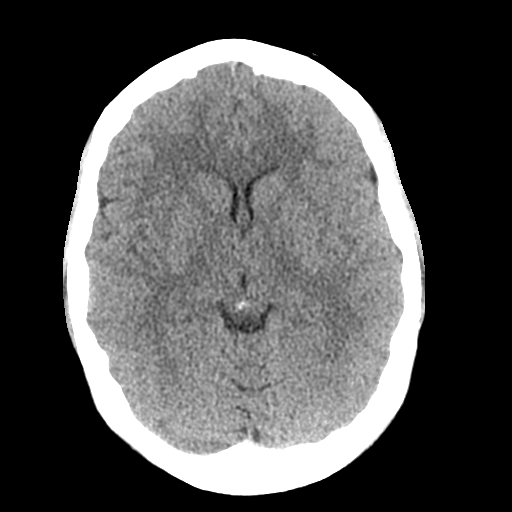
[im 14/28  brain]
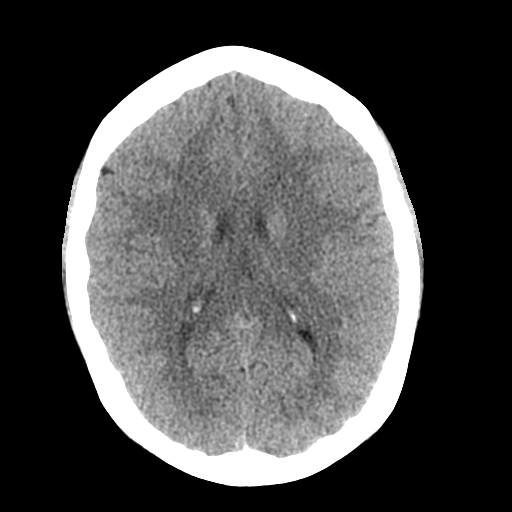
[im 17/28  brain]
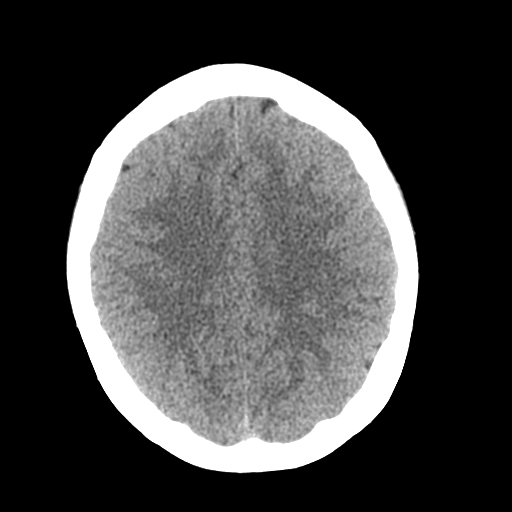
[im 17/28  bone]
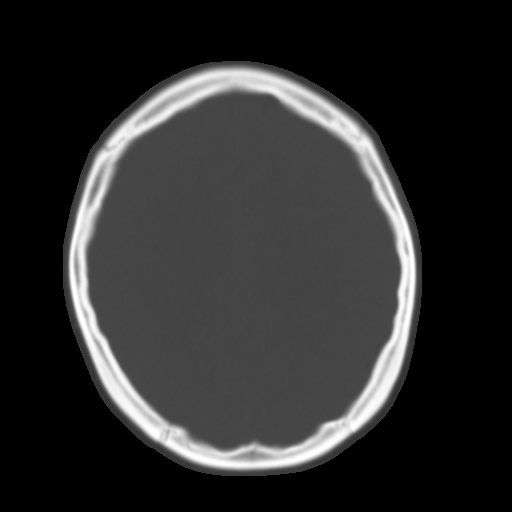
[im 21/28  brain]
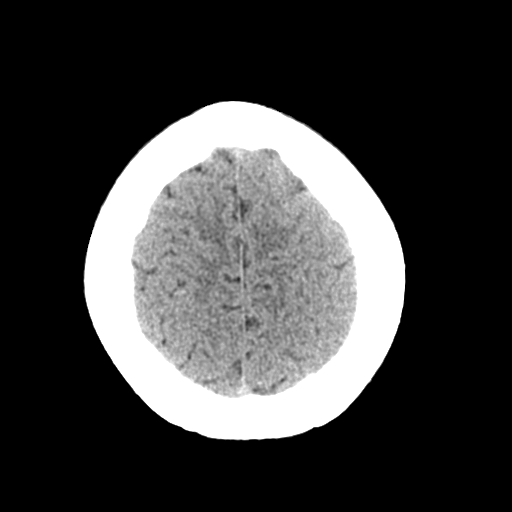
[im 24/28  brain]
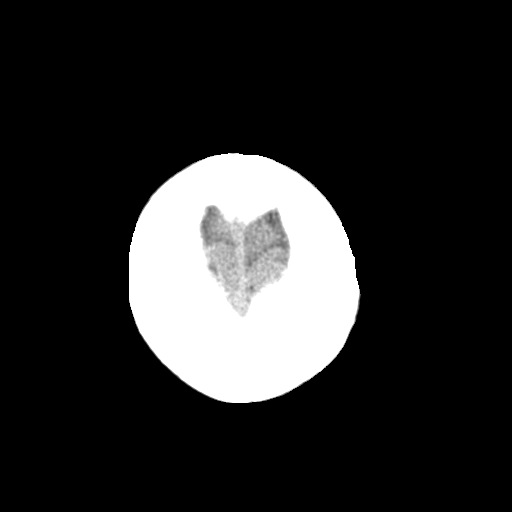

[Series 4: coronal soft tissue · coronal · 0.27mm/px · 3 of 62 slices shown]
[im 21/62  brain]
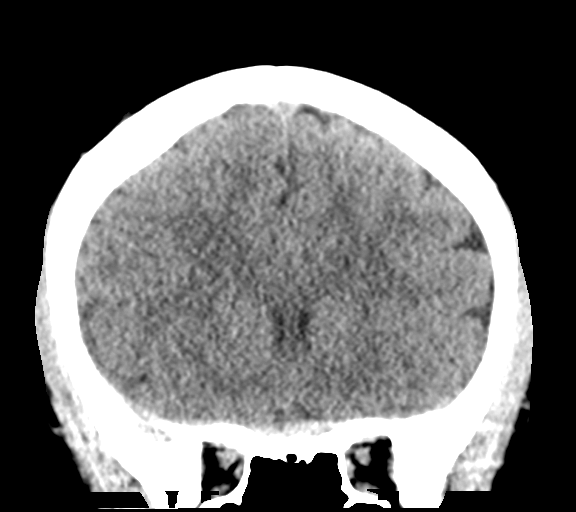
[im 28/62  brain]
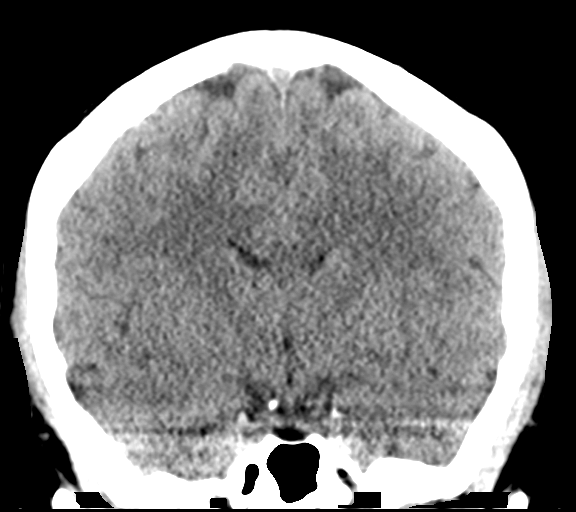
[im 34/62  brain]
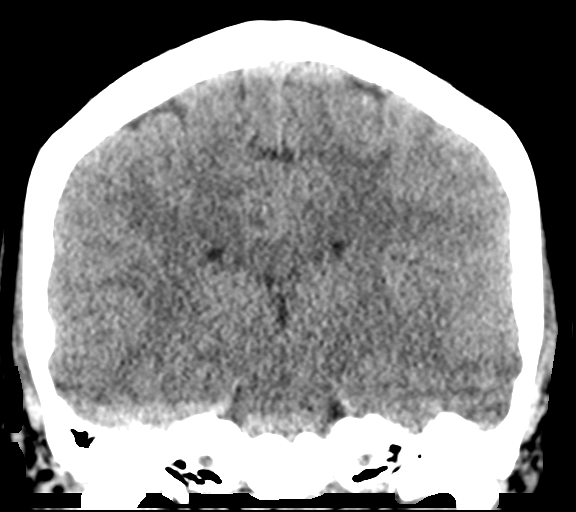

[Series 5: sagittal soft tissue · sagittal · 0.27mm/px · 3 of 52 slices shown]
[im 18/52  brain]
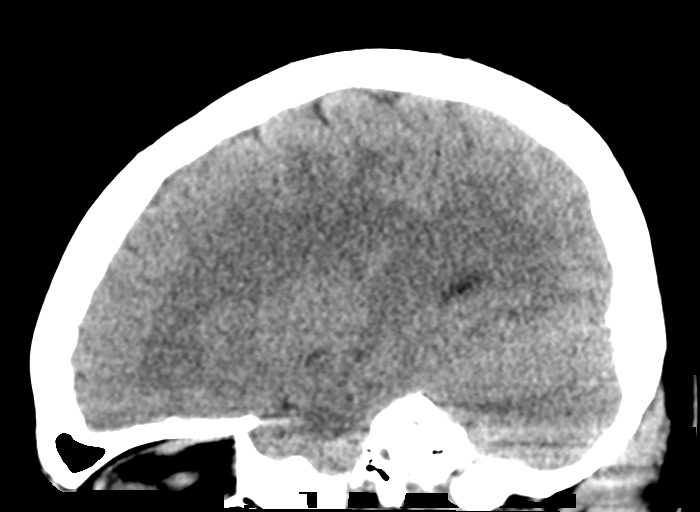
[im 26/52  brain]
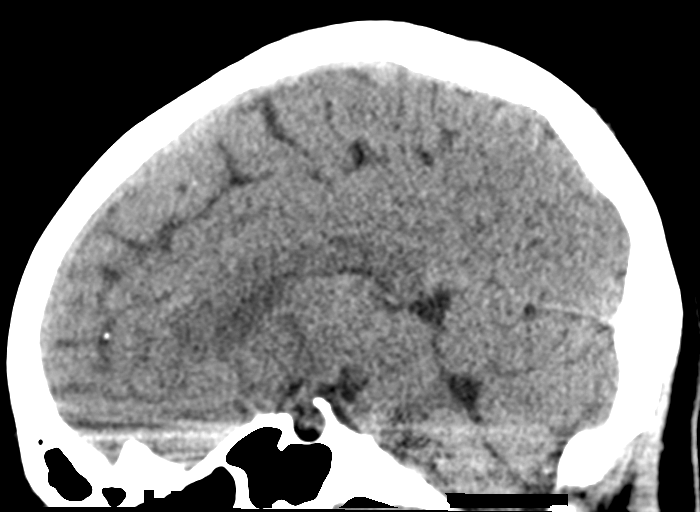
[im 35/52  brain]
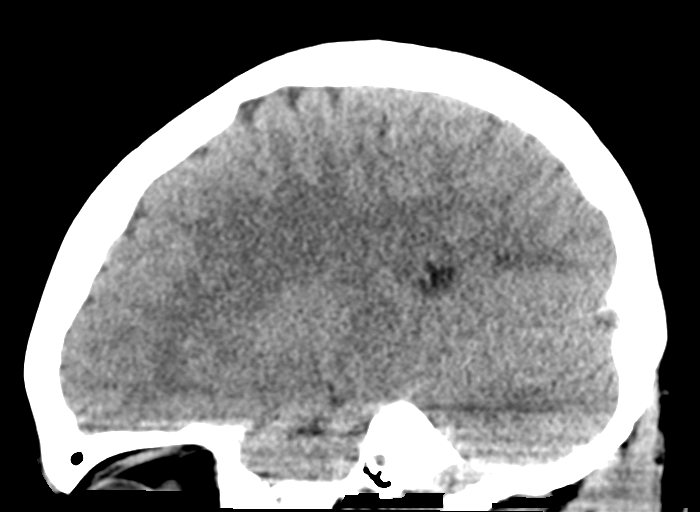

[16 of 47 positions shown; findings below may reference images not displayed]

FINDINGS: Brain: No intracranial hemorrhage, mass effect, or midline shift. No
hydrocephalus. The basilar cisterns are patent. No evidence of
territorial infarct or acute ischemia. No extra-axial or
intracranial fluid collection.

Vascular: No hyperdense vessel or unexpected calcification.

Skull: No fracture or focal lesion.

Sinuses/Orbits: Paranasal sinuses and mastoid air cells are clear.
The visualized orbits are unremarkable.

Other: None.
IMPRESSION: Negative head CT.

## 2021-11-22 ENCOUNTER — Telehealth (INDEPENDENT_AMBULATORY_CARE_PROVIDER_SITE_OTHER): Payer: Medicaid Other | Admitting: Child and Adolescent Psychiatry

## 2021-11-22 DIAGNOSIS — F422 Mixed obsessional thoughts and acts: Secondary | ICD-10-CM | POA: Diagnosis not present

## 2021-11-22 DIAGNOSIS — G4709 Other insomnia: Secondary | ICD-10-CM | POA: Diagnosis not present

## 2021-11-22 DIAGNOSIS — F418 Other specified anxiety disorders: Secondary | ICD-10-CM | POA: Diagnosis not present

## 2021-11-22 DIAGNOSIS — F3341 Major depressive disorder, recurrent, in partial remission: Secondary | ICD-10-CM

## 2021-11-22 MED ORDER — MIRTAZAPINE 15 MG PO TABS: 15.0000 mg | ORAL_TABLET | Freq: Every day | ORAL | 1 refills | Status: DC

## 2021-11-22 MED ORDER — FLUOXETINE HCL 40 MG PO CAPS: 40.0000 mg | ORAL_CAPSULE | Freq: Every day | ORAL | 1 refills | Status: DC

## 2021-11-22 MED ORDER — FLUOXETINE HCL 20 MG PO CAPS: 20.0000 mg | ORAL_CAPSULE | Freq: Every day | ORAL | 1 refills | Status: DC

## 2021-11-22 MED ORDER — PROPRANOLOL HCL 10 MG PO TABS: ORAL_TABLET | ORAL | 1 refills | Status: DC

## 2021-11-22 MED ORDER — HYDROXYZINE HCL 25 MG PO TABS: ORAL_TABLET | ORAL | 1 refills | Status: DC

## 2021-11-22 MED ORDER — HYDROXYZINE HCL 50 MG PO TABS: ORAL_TABLET | ORAL | 1 refills | Status: DC

## 2021-11-22 NOTE — Progress Notes (Signed)
Virtual Visit via Video Note  I connected with Gina Rogers on 11/22/21 at  2:00 PM EDT by a video enabled telemedicine application and verified that I am speaking with the correct person using two identifiers.  Location: Patient: home Provider: office   I discussed the limitations of evaluation and management by telemedicine and the availability of in person appointments. The patient expressed understanding and agreed to proceed.    I discussed the assessment and treatment plan with the patient. The patient was provided an opportunity to ask questions and all were answered. The patient agreed with the plan and demonstrated an understanding of the instructions.   The patient was advised to call back or seek an in-person evaluation if the symptoms worsen or if the condition fails to improve as anticipated.  I provided 25 minutes of non-face-to-face time during this encounter.   Darcel SmallingHiren M Jameire Kouba, MD     [facility] MD/PA/NP OP Progress Note 11/22/21  2:00 PM Gina HumphreyLaila Rogers  MRN:  [id number]  Chief Complaint: Medication management follow-up for mood, anxiety, OCD, sleeping difficulties.  HPI: This is a 21 year old female in treatment for anxiety, OCD, sleeping difficulties, mood and also has history of trauma and multiple previous psychiatric hospitalizations.   Gina KailLaila was seen and evaluated over telemedicine encounter for medication management follow-up.  She was present by herself at home and was evaluated alone.  At her last appointment she was recommended to increase the dose of fluoxetine to 60 mg once a day for anxiety and mood.  She reports that she has tolerated increased dose of fluoxetine well without any problems.  She also reports that she has noticed improvement with anxiety and mood however yesterday she was fired from her job where she was working for the last 2 weeks.  She reports that she was disappointed with this, was tearful but trying to get over with this.  She reports that with the  work she was not able to do a lot of things she plans to travel, attend her friend's birthday in October, and also plan to travel to [place] to see her friends from [employer].  She also wants to do old some colleges where she may potentially apply.  She reports that except since yesterday her mood has been "good", rates it around 7 out of 10, 10 being the best mood and anxiety has been nonexistent.  She reports that at work she was trying to learn and keeping things to herself and was not sure whether this was because of her anxiety that she was not talking or because she was trying to learn.  She reports that her usual personality is to keep to herself rather than being very social.  She also then states that she thinks that she has high functioning autism.  She reports that she takes things literally and sometimes has difficulties reading other's emotions or social cues. We discussed that psychological evaluation would help answer her questions.  She verbalized understanding.  [facility] discussed which [facility].  She was given details to contact.   She otherwise denies any SI or HI.  She reports that she sleeps well however with medications sometimes she feels groggy in the morning and we discussed to reduce the dose of hydroxyzine at night for sleep.  She reports that she has not been drinking lately and reduce the use of cannabis to about once every day.  She denies any other substance abuse.  She reports that she has been  eating well.   Visit Diagnosis:    ICD-10-CM   1. Recurrent major depressive disorder, in partial remission ([place])  F33.41     2. Other specified anxiety disorders  F41.8 FLUoxetine (PROZAC) 20 MG capsule    FLUoxetine (PROZAC) 40 MG capsule    mirtazapine (REMERON) 15 MG tablet    propranolol (INDERAL) 10 MG tablet    hydrOXYzine (ATARAX) 25 MG tablet    hydrOXYzine (ATARAX) 50 MG tablet    3. Mixed obsessional thoughts and acts  F42.2     4. Other  insomnia  G47.09           Past Psychiatric History: As mentioned in initial H&P, reviewed today, no change   Past Medical History:  Past Medical History:  Diagnosis Date   Anxiety    Asthma    Bipolar 1 disorder ([place])    Bipolar and related disorder ([place]) 03/08/2016   Concussion    Depression    No past surgical history on file.  Family Psychiatric History: As mentioned in initial H&P, reviewed today, no change Family History:  Family History  Problem Relation Age of Onset   Anxiety disorder Maternal Uncle    Depression Maternal Uncle     Social History:  Social History   Socioeconomic History   Marital status: Single    Spouse name: Not on file   Number of children: 0   Years of education: Not on file   Highest education level: 11th grade  Occupational History    Comment: part time   Tobacco Use   Smoking status: Never   Smokeless tobacco: Never  Vaping Use   Vaping Use: Some days   Substances: Nicotine  Substance and Sexual Activity   Alcohol use: No   Drug use: Never   Sexual activity: Never  Other Topics Concern   Not on file  Social History Narrative   Not on file   Social Determinants of Health   Financial Resource Strain: Low Risk  (01/22/2018)   Overall Financial Resource Strain (CARDIA)    Difficulty of Paying Living Expenses: Not hard at all  Food Insecurity: No Food Insecurity (01/22/2018)   Hunger Vital Sign    Worried About Running Out of Food in the Last Year: Never true    Ran Out of Food in the Last Year: Never true  Transportation Needs: No Transportation Needs (01/22/2018)   PRAPARE - Hydrologist (Medical): No    Lack of Transportation (Non-Medical): No  Physical Activity: Sufficiently Active (01/22/2018)   Exercise Vital Sign    Days of Exercise per Week: 5 days    Minutes of Exercise per Session: 50 min  Stress: Stress Concern Present (01/22/2018)   [place]    Feeling of Stress : Very much  Social Connections: Unknown (01/22/2018)   Social Connection and Isolation Panel [NHANES]    Frequency of Communication with Friends and Family: Not on file    Frequency of Social Gatherings with Friends and Family: Not on file    Attends Religious Services: Never    Active Member of Clubs or Organizations: No    Attends Archivist Meetings: Never    Marital Status: Not on file    Allergies:  Allergies  Allergen Reactions   Adhesive [Tape]    Tapentadol    Ativan [Lorazepam] Other (See Comments)    Mother does not want pt to receive -  pt is not allergic.    Metabolic Disorder Labs: No results found for: "HGBA1C", "MPG" No results found for: "PROLACTIN" No results found for: "CHOL", "TRIG", "HDL", "CHOLHDL", "VLDL", "LDLCALC" No results found for: "TSH"  Therapeutic Level Labs: No results found for: "LITHIUM" No results found for: "VALPROATE" No results found for: "CBMZ"  Current Medications: Current Outpatient Medications  Medication Sig Dispense Refill   docusate sodium (COLACE) 100 MG capsule Take 2 capsules (200 mg total) by mouth 2 (two) times daily. 120 capsule 0   FLUoxetine (PROZAC) 20 MG capsule Take 1 capsule (20 mg total) by mouth daily. To be combined with Fluoxetine 40 mg daily. 30 capsule 1   FLUoxetine (PROZAC) 40 MG capsule Take 1 capsule (40 mg total) by mouth daily. 30 capsule 1   hydrOXYzine (ATARAX) 25 MG tablet TAKE 1 TABLET BY MOUTH EVERY MORNING AND AT NOON 60 tablet 1   hydrOXYzine (ATARAX) 50 MG tablet TAKE 1 TABLET(50 MG) BY MOUTH AT BEDTIME 30 tablet 1   mirtazapine (REMERON) 15 MG tablet Take 1 tablet (15 mg total) by mouth at bedtime. 30 tablet 1   PROAIR HFA 108 (90 Base) MCG/ACT inhaler   1   propranolol (INDERAL) 10 MG tablet TAKE 1 TABLET(10 MG) BY MOUTH TWICE DAILY 60 tablet 1   RETIN-A 0.1 % cream APP A PEA SIZED AMT TO DRY FACE NIGHTLY  3   witch hazel-glycerin  (TUCKS) pad Apply 1 application topically as needed for itching. 40 each 12   No current facility-administered medications for this visit.     Musculoskeletal:  Gait & Station: unable to assess since visit was over the telemedicine. Patient leans: N/A  Psychiatric Specialty Exam: ROSReview of 12 systems negative except as mentioned in HPI  unknown if currently breastfeeding.There is no height or weight on file to calculate BMI.   Mental Status Exam: Appearance: casually dressed; well groomed; no overt signs of trauma or distress noted Attitude: calm, cooperative with good eye contact Activity: No PMA/PMR, no tics/no tremors; no EPS noted  Speech: normal rate, rhythm and volume Thought Process: Logical, linear, and goal-directed.  Associations: no looseness, tangentiality, circumstantiality, flight of ideas, thought blocking or word salad noted Thought Content: (abnormal/psychotic thoughts): no abnormal or delusional thought process evidenced SI/HI: denies Si/Hi Perception: no illusions or visual/auditory hallucinations noted; no response to internal stimuli demonstrated Mood & Affect: "ok/restricted Judgment & Insight: both fair Attention and Concentration : Good Cognition : WNL Language : Good ADL - Intact   Screenings: AIMS    Flowsheet [facility]  AIMS Total Score 0      AUDIT    Flowsheet Row Ad[facility]) [facility]  Alcohol Use Disorder Identification Test Final Score (AUDIT) 0       Synopsis: - 21 year-old biracial female, 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, 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 Mother had previously expressed concerns for bipolar disorder, but  it appears less likely in the absence of typical manic/hypomanic episodes which are consistent with  bipolar disorder.  - Pt also reports hx most likely consistent with OCD  Update on 11/22/21 - Returns to clinic for 1.5 month follow up - Appears to have overall stability with mood.  Anxiety and OCD, however was fired from job yesterday and therefore adjusting to the new stressor.  Because of overall stability we discussed to continue with current medications and she was strongly recommended [facility] and [facility] appointment.  Continues to use cannabis intermittently but denies drinking recently.  Referring to [facility] with her consent due to concerns for ASD and further diagnostic clarification.     Plan: Plan reviewed on 11/22/20  # Mood, Anxiety (chronic, stable), OCD (chronic and stable) - Continue Prozac 60 mg daily.  - Continue with Remeron 15 mg QHS - Continue Atarax 25 mg QAM, 50 mg QHS, and continue atarax 25 mg PRN at noon,  - Continue Propranolol 10 mg BID.   Insomnia: - Continue Remeron 15 mg QHS - Continue Atarax 50 mg QHS    This note was generated in part or whole with voice recognition software. Voice recognition is usually quite accurate but there are transcription errors that can and very often do occur. I apologize for any typographical errors that were not detected and corrected.   MDM = 2 or more chronic stable conditions + med management       Darcel Smalling, MD 11/22/21  5:15 PM

## 2022-01-10 ENCOUNTER — Telehealth (INDEPENDENT_AMBULATORY_CARE_PROVIDER_SITE_OTHER): Payer: Medicaid Other | Admitting: Child and Adolescent Psychiatry

## 2022-01-10 DIAGNOSIS — F418 Other specified anxiety disorders: Secondary | ICD-10-CM | POA: Diagnosis not present

## 2022-01-10 DIAGNOSIS — F3341 Major depressive disorder, recurrent, in partial remission: Secondary | ICD-10-CM

## 2022-01-10 DIAGNOSIS — F422 Mixed obsessional thoughts and acts: Secondary | ICD-10-CM

## 2022-01-10 MED ORDER — FLUOXETINE HCL 20 MG PO CAPS: 20.0000 mg | ORAL_CAPSULE | Freq: Every day | ORAL | 1 refills | Status: DC

## 2022-01-10 MED ORDER — MIRTAZAPINE 15 MG PO TABS: 15.0000 mg | ORAL_TABLET | Freq: Every day | ORAL | 1 refills | Status: DC

## 2022-01-10 MED ORDER — FLUOXETINE HCL 40 MG PO CAPS: 40.0000 mg | ORAL_CAPSULE | Freq: Every day | ORAL | 1 refills | Status: DC

## 2022-01-10 MED ORDER — HYDROXYZINE HCL 50 MG PO TABS: ORAL_TABLET | ORAL | 1 refills | Status: DC

## 2022-01-10 NOTE — Progress Notes (Signed)
Virtual Visit via Video Note  I connected with Gina Rogers on 01/10/22 at 10:30 AM EST by a video enabled telemedicine application and verified that I am speaking with the correct person using two identifiers.  Location: Patient: home Provider: office   I discussed the limitations of evaluation and management by telemedicine and the availability of in person appointments. The patient expressed understanding and agreed to proceed.    I discussed the assessment and treatment plan with the patient. The patient was provided an opportunity to ask questions and all were answered. The patient agreed with the plan and demonstrated an understanding of the instructions.   The patient was advised to call back or seek an in-person evaluation if the symptoms worsen or if the condition fails to improve as anticipated.  I provided 25 minutes of non-face-to-face time during this encounter.   Darcel Smalling, MD     [facility] MD/PA/NP OP Progress Note 01/10/22  10:30 AM Gina Rogers  MRN:  [id number]  Chief Complaint: Medication management follow-up for mood, anxiety, OCD and sleeping difficulties.    HPI: This is a 21 year old female in treatment for anxiety, OCD, sleeping difficulties, mood and also has history of trauma and multiple previous psychiatric hospitalizations in her early adolescent years.   Gina Rogers was seen and evaluated over telemedicine encounter for medication management follow-up.  She was present by herself and was evaluated alone.  She reports that since last appointment she has been doing "pretty good".  She reports that she went with her friends to celebrate birthday, and enjoyed it.  She reports that she has not had much contact with her friends in the last 2 weeks but still doing well.  Denies any low lows or depressive episodes.  She is sleeping well.  She denies any SI or HI.  She also denies problems with anxiety.  She reports that she still has obsession about germs but overall her OCD  is manageable.  She reports that this week she is planning to find a job for herself, also plans to check out at [facility] to see if she can work out at a discounted rate.  She continues to see her neighbor frequently, denies problems with mother but they do not communicate with each other.  She has been compliant with her medications and denies any problems with them.  We also discussed to get back in therapy.  [facility], suggested that she call them and schedule an appointment.  If she has problems scheduling appointment then she can call back at this clinic and schedule an appointment with therapist at this clinic.  She verbalized understanding.  We discussed to continue with current medications and follow back in about 6 to 8 weeks or earlier if needed.  Visit Diagnosis:    ICD-10-CM   1. Recurrent major depressive disorder, in partial remission (H[facility])  F33.41     2. Other specified anxiety disorders  F41.8 FLUoxetine (PROZAC) 40 MG capsule    FLUoxetine (PROZAC) 20 MG capsule    hydrOXYzine (ATARAX) 50 MG tablet    mirtazapine (REMERON) 15 MG tablet    3. Mixed obsessional thoughts and acts  F42.2           Past Psychiatric History: As mentioned in initial H&P, reviewed today, no change   Past Medical History:  Past Medical History:  Diagnosis Date   Anxiety    Asthma    Bipolar 1 disorder (H[facility])  Bipolar and related disorder (HCC) 03/08/2016   Concussion    Depression    No past surgical history on file.  Family Psychiatric History: As mentioned in initial H&P, reviewed today, no change Family History:  Family History  Problem Relation Age of Onset   Anxiety disorder Maternal Uncle    Depression Maternal Uncle     Social History:  Social History   Socioeconomic History   Marital status: Single    Spouse name: Not on file   Number of children: 0   Years of education: Not on file   Highest education level: 11th grade   Occupational History    Comment: part time   Tobacco Use   Smoking status: Never   Smokeless tobacco: Never  Vaping Use   Vaping Use: Some days   Substances: Nicotine  Substance and Sexual Activity   Alcohol use: No   Drug use: Never   Sexual activity: Never  Other Topics Concern   Not on file  Social History Narrative   Not on file   Social Determinants of Health   Financial Resource Strain: Low Risk  (01/22/2018)   Overall Financial Resource Strain (CARDIA)    Difficulty of Paying Living Expenses: Not hard at all  Food Insecurity: No Food Insecurity (01/22/2018)   Hunger Vital Sign    Worried About Running Out of Food in the Last Year: Never true    Ran Out of Food in the Last Year: Never true  Transportation Needs: No Transportation Needs (01/22/2018)   PRAPARE - Administrator, Civil Service (Medical): No    Lack of Transportation (Non-Medical): No  Physical Activity: Sufficiently Active (01/22/2018)   Exercise Vital Sign    Days of Exercise per Week: 5 days    Minutes of Exercise per Session: 50 min  Stress: Stress Concern Present (01/22/2018)   [employer] of Occupational Health - Occupational Stress Questionnaire    Feeling of Stress : Very much  Social Connections: Unknown (01/22/2018)   Social Connection and Isolation Panel [NHANES]    Frequency of Communication with Friends and Family: Not on file    Frequency of Social Gatherings with Friends and Family: Not on file    Attends Religious Services: Never    Active Member of Clubs or Organizations: No    Attends Banker Meetings: Never    Marital Status: Not on file    Allergies:  Allergies  Allergen Reactions   Adhesive [Tape]    Tapentadol    Ativan [Lorazepam] Other (See Comments)    Mother does not want pt to receive - pt is not allergic.    Metabolic Disorder Labs: No results found for: "HGBA1C", "MPG" No results found for: "PROLACTIN" No results found for:  "CHOL", "TRIG", "HDL", "CHOLHDL", "VLDL", "LDLCALC" No results found for: "TSH"  Therapeutic Level Labs: No results found for: "LITHIUM" No results found for: "VALPROATE" No results found for: "CBMZ"  Current Medications: Current Outpatient Medications  Medication Sig Dispense Refill   docusate sodium (COLACE) 100 MG capsule Take 2 capsules (200 mg total) by mouth 2 (two) times daily. 120 capsule 0   FLUoxetine (PROZAC) 20 MG capsule Take 1 capsule (20 mg total) by mouth daily. To be combined with Fluoxetine 40 mg daily. 30 capsule 1   FLUoxetine (PROZAC) 40 MG capsule Take 1 capsule (40 mg total) by mouth daily. 30 capsule 1   hydrOXYzine (ATARAX) 25 MG tablet TAKE 1 TABLET BY MOUTH EVERY MORNING AND  AT NOON 60 tablet 1   hydrOXYzine (ATARAX) 50 MG tablet TAKE 1 TABLET(50 MG) BY MOUTH AT BEDTIME 30 tablet 1   mirtazapine (REMERON) 15 MG tablet Take 1 tablet (15 mg total) by mouth at bedtime. 30 tablet 1   PROAIR HFA 108 (90 Base) MCG/ACT inhaler   1   propranolol (INDERAL) 10 MG tablet TAKE 1 TABLET(10 MG) BY MOUTH TWICE DAILY 60 tablet 1   RETIN-A 0.1 % cream APP A PEA SIZED AMT TO DRY FACE NIGHTLY  3   witch hazel-glycerin (TUCKS) pad Apply 1 application topically as needed for itching. 40 each 12   No current facility-administered medications for this visit.     Musculoskeletal:  Gait & Station: unable to assess since visit was over the telemedicine. Patient leans: N/A  Psychiatric Specialty Exam: ROSReview of 12 systems negative except as mentioned in HPI  unknown if currently breastfeeding.There is no height or weight on file to calculate BMI.   Mental Status Exam: Appearance: casually dressed; well groomed; no overt signs of trauma or distress noted Attitude: calm, cooperative with good eye contact Activity: No PMA/PMR, no tics/no tremors; no EPS noted  Speech: normal rate, rhythm and volume Thought Process: Logical, linear, and goal-directed.  Associations: no  looseness, tangentiality, circumstantiality, flight of ideas, thought blocking or word salad noted Thought Content: (abnormal/psychotic thoughts): no abnormal or delusional thought process evidenced SI/HI: denies Si/Hi Perception: no illusions or visual/auditory hallucinations noted; no response to internal stimuli demonstrated Mood & Affect: "good"/full range, neutral Judgment & Insight: both fair Attention and Concentration : Good Cognition : WNL Language : Good ADL - Intact   Screenings: AIMS    Flowsheet Row [facility]  AIMS Total Score 0      AUDIT    Flowsheet Row Admission ([facility]charged[facility]  Alcohol Use Disorder Identification Test Final Score (AUDIT) 0       Synopsis: - 21 year-old biracial female, 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, previously Archivist at [employer] with medical history significant of bronchial asthma and migraine, and psychiatric history significant of multiple previous psychiatric hospitalizations, previous suicide attempts and carries psychiatric diagnoses of major depressive disorder, anxiety disorders and bipolar disorder referred by her therapist for psychiatric medication management due to transportation problems because pt's last provider was at [facility] in [place] in 2019   Assessment and Plan:   - Pt's presentation appears most consistent with generalized and social anxiety disorder, with recurrent depression. Mother had previously expressed concerns for bipolar disorder, but it appears less likely in the absence of typical manic/hypomanic episodes which are consistent with  bipolar disorder.  - Pt also reports hx most likely consistent with OCD  Update on 01/10/2022 - Returns to clinic after 1.5 month for follow up.  She appears to have overall stability with mood, anxiety  and OCD, despite current psychosocial stressors.  She has not yet [facility] to [facility] but agrees to do it today.  Continues to use cannabis intermittently, denies other substance use.  Recommending to continue with current medications because of stability with her symptoms.     Plan: Plan reviewed on 01/10/2022  # Mood, Anxiety (chronic, stable), OCD (chronic and stable) - Continue Prozac 60 mg daily.  - Continue with Remeron 15 mg QHS - Continue Atarax 25 mg QAM PRN for anxiety, 50 mg QHS, and continue atarax 25 mg  PRN at noon,  - Continue Propranolol 10 mg BID.   Insomnia: - Continue Remeron 15 mg QHS - Continue Atarax 50 mg QHS    This note was generated in part or whole with voice recognition software. Voice recognition is usually quite accurate but there are transcription errors that can and very often do occur. I apologize for any typographical errors that were not detected and corrected.   MDM = 2 or more chronic stable conditions + med management       Darcel Smalling, MD 01/10/22  11:00 AM

## 2022-03-06 ENCOUNTER — Telehealth (INDEPENDENT_AMBULATORY_CARE_PROVIDER_SITE_OTHER): Payer: Medicaid Other | Admitting: Child and Adolescent Psychiatry

## 2022-03-06 DIAGNOSIS — F418 Other specified anxiety disorders: Secondary | ICD-10-CM

## 2022-03-06 NOTE — Progress Notes (Signed)
Virtual Visit via Video Note  I connected with [name] on 03/06/22 at 10:30 AM EST by a video enabled telemedicine application and verified that I am speaking with the correct person using two identifiers.  Location: Patient: home Provider: office   I discussed the limitations of evaluation and management by telemedicine and the availability of in person appointments. The patient expressed understanding and agreed to proceed.    I discussed the assessment and treatment plan with the patient. The patient was provided an opportunity to ask questions and all were answered. The patient agreed with the plan and demonstrated an understanding of the instructions.   The patient was advised to call back or seek an in-person evaluation if the symptoms worsen or if the condition fails to improve as anticipated.  I provided 25 minutes of non-face-to-face time during this encounter.   Darcel Smalling, MD     [facility] MD/PA/NP OP Progress Note 03/06/22  10:30 AM Mlissa Tamayo  MRN:  [id number]  Chief Complaint: Medication management follow-up for mood, anxiety, OCD and sleeping difficulties.  HPI: This is a 22 year old female in treatment for anxiety, OCD, sleeping difficulties, mood and also has history of trauma and multiple previous psychiatric hospitalizations in her early adolescent years.   Itzy was seen and evaluated over telemedicine encounter for medication management follow-up.  She was running 10 minutes late for her appointment.  She says that  Despina was seen and evaluated over telemedicine encounter for medication management follow-up.  She was present by herself and was evaluated alone.  She reports that since last appointment she has been doing "pretty good".  She reports that she went with her friends to celebrate birthday, and enjoyed it.  She reports that she has not had much contact with her friends in the last 2 weeks but still doing well.  Denies any low lows or depressive  episodes.  She is sleeping well.  She denies any SI or HI.  She also denies problems with anxiety.  She reports that she still has obsession about germs but overall her OCD is manageable.  She reports that this week she is planning to find a job for herself, also plans to check out at [facility] to see if she can work out at a discounted rate.  She continues to see her neighbor frequently, denies problems with mother but they do not communicate with each other.  She has been compliant with her medications and denies any problems with them.  We also discussed to get back in therapy.  [facility], suggested that she call them and schedule an appointment.  If she has problems scheduling appointment then she can call back at this clinic and schedule an appointment with therapist at this clinic.  She verbalized understanding.  We discussed to continue with current medications and follow back in about 6 to 8 weeks or earlier if needed.  Visit Diagnosis:  No diagnosis found.       Past Psychiatric History: As mentioned in initial H&P, reviewed today, no change   Past Medical History:  Past Medical History:  Diagnosis Date  . Anxiety   . Asthma   . Bipolar 1 disorder (HCC)   . Bipolar and related disorder (HCC) 03/08/2016  . Concussion   . Depression    No past surgical history on file.  Family Psychiatric History: As mentioned in initial H&P, reviewed today, no change Family History:  Family History  Problem Relation  Age of Onset  . Anxiety disorder Maternal Uncle   . Depression Maternal Uncle     Social History:  Social History   Socioeconomic History  . Marital status: Single    Spouse name: Not on file  . Number of children: 0  . Years of education: Not on file  . Highest education level: 11th grade  Occupational History    Comment: part time   Tobacco Use  . Smoking status: Never  . Smokeless tobacco: Never  Vaping Use  . Vaping Use: Some  days  . Substances: Nicotine  Substance and Sexual Activity  . Alcohol use: No  . Drug use: Never  . Sexual activity: Never  Other Topics Concern  . Not on file  Social History Narrative  . Not on file   Social Determinants of Health   Financial Resource Strain: Low Risk  (01/22/2018)   Overall Financial Resource Strain (CARDIA)   . Difficulty of Paying Living Expenses: Not hard at all  Food Insecurity: No Food Insecurity (01/22/2018)   Hunger Vital Sign   . Worried About Programme researcher, broadcasting/film/video in the Last Year: Never true   . Ran Out of Food in the Last Year: Never true  Transportation Needs: No Transportation Needs (01/22/2018)   PRAPARE - Transportation   . Lack of Transportation (Medical): No   . Lack of Transportation (Non-Medical): No  Physical Activity: Sufficiently Active (01/22/2018)   Exercise Vital Sign   . Days of Exercise per Week: 5 days   . Minutes of Exercise per Session: 50 min  Stress: Stress Concern Present (01/22/2018)   [employer] of Occupational Health - Occupational Stress Questionnaire   . Feeling of Stress : Very much  Social Connections: Unknown (01/22/2018)   Social Connection and Isolation Panel [NHANES]   . Frequency of Communication with Friends and Family: Not on file   . Frequency of Social Gatherings with Friends and Family: Not on file   . Attends Religious Services: Never   . Active Member of Clubs or Organizations: No   . Attends Banker Meetings: Never   . Marital Status: Not on file    Allergies:  Allergies  Allergen Reactions  . Adhesive [Tape]   . Tapentadol   . Ativan [Lorazepam] Other (See Comments)    Mother does not want pt to receive - pt is not allergic.    Metabolic Disorder Labs: No results found for: "HGBA1C", "MPG" No results found for: "PROLACTIN" No results found for: "CHOL", "TRIG", "HDL", "CHOLHDL", "VLDL", "LDLCALC" No results found for: "TSH"  Therapeutic Level Labs: No results found for:  "LITHIUM" No results found for: "VALPROATE" No results found for: "CBMZ"  Current Medications: Current Outpatient Medications  Medication Sig Dispense Refill  . docusate sodium (COLACE) 100 MG capsule Take 2 capsules (200 mg total) by mouth 2 (two) times daily. 120 capsule 0  . FLUoxetine (PROZAC) 20 MG capsule Take 1 capsule (20 mg total) by mouth daily. To be combined with Fluoxetine 40 mg daily. 30 capsule 1  . FLUoxetine (PROZAC) 40 MG capsule Take 1 capsule (40 mg total) by mouth daily. 30 capsule 1  . hydrOXYzine (ATARAX) 25 MG tablet TAKE 1 TABLET BY MOUTH EVERY MORNING AND AT NOON 60 tablet 1  . hydrOXYzine (ATARAX) 50 MG tablet TAKE 1 TABLET(50 MG) BY MOUTH AT BEDTIME 30 tablet 1  . mirtazapine (REMERON) 15 MG tablet Take 1 tablet (15 mg total) by mouth at bedtime. 30 tablet 1  .  PROAIR HFA 108 (90 Base) MCG/ACT inhaler   1  . propranolol (INDERAL) 10 MG tablet TAKE 1 TABLET(10 MG) BY MOUTH TWICE DAILY 60 tablet 1  . RETIN-A 0.1 % cream APP A PEA SIZED AMT TO DRY FACE NIGHTLY  3  . witch hazel-glycerin (TUCKS) pad Apply 1 application topically as needed for itching. 40 each 12   No current facility-administered medications for this visit.     Musculoskeletal:  Gait & Station: unable to assess since visit was over the telemedicine. Patient leans: N/A  Psychiatric Specialty Exam: ROSReview of 12 systems negative except as mentioned in HPI  unknown if currently breastfeeding.There is no height or weight on file to calculate BMI.   Mental Status Exam: Appearance: casually dressed; well groomed; no overt signs of trauma or distress noted Attitude: calm, cooperative with good eye contact Activity: No PMA/PMR, no tics/no tremors; no EPS noted  Speech: normal rate, rhythm and volume Thought Process: Logical, linear, and goal-directed.  Associations: no looseness, tangentiality, circumstantiality, flight of ideas, thought blocking or word salad noted Thought Content:  (abnormal/psychotic thoughts): no abnormal or delusional thought process evidenced SI/HI: denies Si/Hi Perception: no illusions or visual/auditory hallucinations noted; no response to internal stimuli demonstrated Mood & Affect: "good"/full range, neutral Judgment & Insight: both fair Attention and Concentration : Good Cognition : WNL Language : Good ADL - Intact   Screenings: AIMS    Flowsheet Row Admission (Discharged) from 03/07/2016 in [place] Total Score 0      AUDIT    Flowsheet Row Admission (Discharged) from 03/07/2016 in [place] CHILD/ADOLES 100B  Alcohol Use Disorder Identification Test Final Score (AUDIT) 0       Synopsis: - 22 year-old biracial female, 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, previously Electronics engineer at [employer] with medical history significant of bronchial asthma and migraine, and psychiatric history significant of multiple previous psychiatric hospitalizations, previous suicide attempts and carries psychiatric diagnoses of major depressive disorder, anxiety disorders and bipolar disorder referred by her therapist for psychiatric medication management due to transportation problems because pt's last provider was at [facility] in [place] in 2019   Assessment and Plan:   - Pt's presentation appears most consistent with generalized and social anxiety disorder, with recurrent depression. Mother had previously expressed concerns for bipolar disorder, but it appears less likely in the absence of typical manic/hypomanic episodes which are consistent with  bipolar disorder.  - Pt also reports hx most likely consistent with OCD  Update on 01/10/2022 - Returns to clinic after 1.5 month for follow up.  She appears to have overall stability with mood, anxiety and OCD, despite current psychosocial stressors.  She [facility] not [facility] but agrees to do it  today.  Continues to use cannabis intermittently, denies other substance use.  Recommending to continue with current medications because of stability with her symptoms.     Plan: Plan reviewed on 01/10/2022  # Mood, Anxiety (chronic, stable), OCD (chronic and stable) - Continue Prozac 60 mg daily.  - Continue with Remeron 15 mg QHS - Continue Atarax 25 mg QAM PRN for anxiety, 50 mg QHS, and continue atarax 25 mg PRN at noon,  - Continue Propranolol 10 mg BID.   Insomnia: - Continue Remeron 15 mg QHS - Continue Atarax 50 mg QHS    This note was generated in part or whole with voice recognition software. Voice recognition is usually quite  accurate but there are transcription errors that can and very often do occur. I apologize for any typographical errors that were not detected and corrected.   MDM = 2 or more chronic stable conditions + med management       [name], MD 03/06/22  10:32 AM

## 2022-03-07 MED ORDER — FLUOXETINE HCL 20 MG PO CAPS: 20.0000 mg | ORAL_CAPSULE | Freq: Every day | ORAL | 1 refills | Status: DC

## 2022-03-07 MED ORDER — MIRTAZAPINE 15 MG PO TABS: 15.0000 mg | ORAL_TABLET | Freq: Every day | ORAL | 1 refills | Status: DC

## 2022-03-07 MED ORDER — FLUOXETINE HCL 40 MG PO CAPS: 40.0000 mg | ORAL_CAPSULE | Freq: Every day | ORAL | 1 refills | Status: DC

## 2022-03-07 MED ORDER — PROPRANOLOL HCL 10 MG PO TABS: ORAL_TABLET | ORAL | 1 refills | Status: DC

## 2022-03-07 MED ORDER — HYDROXYZINE HCL 25 MG PO TABS: ORAL_TABLET | ORAL | 1 refills | Status: DC

## 2022-04-17 ENCOUNTER — Telehealth (INDEPENDENT_AMBULATORY_CARE_PROVIDER_SITE_OTHER): Payer: Medicaid Other | Admitting: Child and Adolescent Psychiatry

## 2022-04-17 DIAGNOSIS — F3341 Major depressive disorder, recurrent, in partial remission: Secondary | ICD-10-CM

## 2022-04-17 DIAGNOSIS — G4709 Other insomnia: Secondary | ICD-10-CM | POA: Diagnosis not present

## 2022-04-17 DIAGNOSIS — F418 Other specified anxiety disorders: Secondary | ICD-10-CM

## 2022-04-17 DIAGNOSIS — F422 Mixed obsessional thoughts and acts: Secondary | ICD-10-CM

## 2022-04-17 MED ORDER — MIRTAZAPINE 15 MG PO TABS: 15.0000 mg | ORAL_TABLET | Freq: Every day | ORAL | 1 refills | Status: DC

## 2022-04-17 MED ORDER — PROPRANOLOL HCL 10 MG PO TABS: ORAL_TABLET | ORAL | 1 refills | Status: DC

## 2022-04-17 MED ORDER — FLUOXETINE HCL 20 MG PO CAPS: 20.0000 mg | ORAL_CAPSULE | Freq: Every day | ORAL | 1 refills | Status: DC

## 2022-04-17 MED ORDER — HYDROXYZINE HCL 25 MG PO TABS: ORAL_TABLET | ORAL | 1 refills | Status: DC

## 2022-04-17 MED ORDER — FLUOXETINE HCL 40 MG PO CAPS: 40.0000 mg | ORAL_CAPSULE | Freq: Every day | ORAL | 1 refills | Status: DC

## 2022-04-17 MED ORDER — HYDROXYZINE HCL 50 MG PO TABS: ORAL_TABLET | ORAL | 1 refills | Status: DC

## 2022-04-17 NOTE — Progress Notes (Signed)
Virtual Visit via Video Note  I connected with Gina Rogers on 03/06/22 at 11:00 AM EST by a video enabled telemedicine application and verified that I am speaking with the correct person using two identifiers.  Location: Patient: home Provider: office   I discussed the limitations of evaluation and management by telemedicine and the availability of in person appointments. The patient expressed understanding and agreed to proceed.    I discussed the assessment and treatment plan with the patient. The patient was provided an opportunity to ask questions and all were answered. The patient agreed with the plan and demonstrated an understanding of the instructions.   The patient was advised to call back or seek an in-person evaluation if the symptoms worsen or if the condition fails to improve as anticipated.   Gina Erm, MD     [facility] MD/PA/NP OP Progress Note 03/06/22  10:30 AM Gina Rogers  MRN:  [id number]  Chief Complaint: Medication management follow-up for mood, anxiety, OCD and sleeping difficulties.  HPI: This is a 22 year old female in treatment for anxiety, OCD, sleeping difficulties, mood and also has history of trauma and multiple previous psychiatric hospitalizations in her early adolescent years.   Gina Rogers was seen and evaluated over telemedicine encounter for medication management follow-up.  She states that she has continued to work at [employer], and also has been studying through [employer].  She says that her manager has been putting her for almost 40 hours a week which has been causing some challenges with balancing her commitments to studies but she is going to talk to the manager to reduce the hours.  She says that work has been going well, it does increase some anxiety but she has been getting better with that.  Overall she denies excessive worries or anxiety except a couple of weeks ago when her friends were not doing well in [place], she was  worried about them and that also led to decreasing her mood.  She says that she was able to bounce back from that, by staying busy with the work.  She says that she has been noticing some more perfectionist tendencies with organization at work but she has been mindful and it has been actually helpful to her at work.  She denies any SI or HI, she sleeps about 7 to 8 hours and sleep has been decent.  She denies problems with appetite.  She drinks 2 beers on Saturdays, continues to use cannabis on every day basis which she finds helpful.  Again encouraged her to be mindful about her use and cut down.  She has been consistently taking her medications and therefore recommended to continue with them.  Encouraged her to be in psychotherapy, she was previously placed on a waiting list here, however she did not have transportation and therefore she declined however can do appointments now.  We will let the friend's know about putting her back on the waiting list.    Visit Diagnosis:    ICD-10-CM   1. Mixed obsessional thoughts and acts  F42.2     2. Other specified anxiety disorders  F41.8 FLUoxetine (PROZAC) 40 MG capsule    FLUoxetine (PROZAC) 20 MG capsule    hydrOXYzine (ATARAX) 25 MG tablet    mirtazapine (REMERON) 15 MG tablet    propranolol (INDERAL) 10 MG tablet    hydrOXYzine (ATARAX) 50 MG tablet    3. Recurrent major depressive disorder, in partial remission ([place])  F33.41  4. Other insomnia  G47.09            Past Psychiatric History: As mentioned in initial H&P, reviewed today, no change   Past Medical History:  Past Medical History:  Diagnosis Date   Anxiety    Asthma    Bipolar 1 disorder ([place])    Bipolar and related disorder ([place]) 03/08/2016   Concussion    Depression    No past surgical history on file.  Family Psychiatric History: As mentioned in initial H&P, reviewed today, no change Family History:  Family History  Problem Relation Age of Onset   Anxiety  disorder Maternal Uncle    Depression Maternal Uncle     Social History:  Social History   Socioeconomic History   Marital status: Single    Spouse name: Not on file   Number of children: 0   Years of education: Not on file   Highest education level: 11th grade  Occupational History    Comment: part time   Tobacco Use   Smoking status: Never   Smokeless tobacco: Never  Vaping Use   Vaping Use: Some days   Substances: Nicotine  Substance and Sexual Activity   Alcohol use: No   Drug use: Never   Sexual activity: Never  Other Topics Concern   Not on file  Social History Narrative   Not on file   Social Determinants of Health   Financial Resource Strain: Low Risk  (01/22/2018)   Overall Financial Resource Strain (CARDIA)    Difficulty of Paying Living Expenses: Not hard at all  Food Insecurity: No Food Insecurity (01/22/2018)   Hunger Vital Sign    Worried About Running Out of Food in the Last Year: Never true    Ran Out of Food in the Last Year: Never true  Transportation Needs: No Transportation Needs (01/22/2018)   PRAPARE - Hydrologist (Medical): No    Lack of Transportation (Non-Medical): No  Physical Activity: Sufficiently Active (01/22/2018)   Exercise Vital Sign    Days of Exercise per Week: 5 days    Minutes of Exercise per Session: 50 min  Stress: Stress Concern Present (01/22/2018)   [place]    Feeling of Stress : Very much  Social Connections: Unknown (01/22/2018)   Social Connection and Isolation Panel [NHANES]    Frequency of Communication with Friends and Family: Not on file    Frequency of Social Gatherings with Friends and Family: Not on file    Attends Religious Services: Never    Active Member of Clubs or Organizations: No    Attends Archivist Meetings: Never    Marital Status: Not on file    Allergies:  Allergies  Allergen Reactions    Adhesive [Tape]    Tapentadol    Ativan [Lorazepam] Other (See Comments)    Mother does not want pt to receive - pt is not allergic.    Metabolic Disorder Labs: No results found for: "HGBA1C", "MPG" No results found for: "PROLACTIN" No results found for: "CHOL", "TRIG", "HDL", "CHOLHDL", "VLDL", "LDLCALC" No results found for: "TSH"  Therapeutic Level Labs: No results found for: "LITHIUM" No results found for: "VALPROATE" No results found for: "CBMZ"  Current Medications: Current Outpatient Medications  Medication Sig Dispense Refill   docusate sodium (COLACE) 100 MG capsule Take 2 capsules (200 mg total) by mouth 2 (two) times daily. 120 capsule 0   FLUoxetine (PROZAC)  20 MG capsule Take 1 capsule (20 mg total) by mouth daily. To be combined with Fluoxetine 40 mg daily. 30 capsule 1   FLUoxetine (PROZAC) 40 MG capsule Take 1 capsule (40 mg total) by mouth daily. 30 capsule 1   hydrOXYzine (ATARAX) 25 MG tablet TAKE 1 TABLET BY MOUTH EVERY MORNING AND AT NOON 60 tablet 1   hydrOXYzine (ATARAX) 50 MG tablet TAKE 1 TABLET(50 MG) BY MOUTH AT BEDTIME 30 tablet 1   mirtazapine (REMERON) 15 MG tablet Take 1 tablet (15 mg total) by mouth at bedtime. 30 tablet 1   PROAIR HFA 108 (90 Base) MCG/ACT inhaler   1   propranolol (INDERAL) 10 MG tablet TAKE 1 TABLET(10 MG) BY MOUTH TWICE DAILY 60 tablet 1   RETIN-A 0.1 % cream APP A PEA SIZED AMT TO DRY FACE NIGHTLY  3   witch hazel-glycerin (TUCKS) pad Apply 1 application topically as needed for itching. 40 each 12   No current facility-administered medications for this visit.     Musculoskeletal:  Gait & Station: unable to assess since visit was over the telemedicine. Patient leans: N/A  Psychiatric Specialty Exam: ROSReview of 12 systems negative except as mentioned in HPI  unknown if currently breastfeeding.There is no height or weight on file to calculate BMI.   Mental Status Exam: Appearance: casually dressed; well groomed; no  overt signs of trauma or distress noted Attitude: calm, cooperative with good eye contact Activity: No PMA/PMR, no tics/no tremors; no EPS noted  Speech: normal rate, rhythm and volume Thought Process: Logical, linear, and goal-directed.  Associations: no looseness, tangentiality, circumstantiality, flight of ideas, thought blocking or word salad noted Thought Content: (abnormal/psychotic thoughts): no abnormal or delusional thought process evidenced SI/HI: denies Si/Hi Perception: no illusions or visual/auditory hallucinations noted; no response to internal stimuli demonstrated Mood & Affect: "good"/full range, neutral Judgment & Insight: both fair Attention and Concentration : Good Cognition : WNL Language : Good ADL - Intact   Screenings: AIMS    Flowsheet Row Admission (Discharged) from 03/07/2016 in [place] Total Score 0      AUDIT    Flowsheet Row Admission (Discharged) from 03/07/2016 in [place] CHILD/ADOLES 100B  Alcohol Use Disorder Identification Test Final Score (AUDIT) 0       Synopsis: - 22 year-old biracial female, 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, previously Electronics engineer at [employer] with medical history significant of bronchial asthma and migraine, and psychiatric history significant of multiple previous psychiatric hospitalizations, previous suicide attempts and carries psychiatric diagnoses of major depressive disorder, anxiety disorders and bipolar disorder referred by her therapist for psychiatric medication management due to transportation problems because pt's last provider was at [facility] in [place] in 2019   Assessment and Plan:   - Pt's presentation appears most consistent with generalized and social anxiety disorder, with recurrent depression. Mother had previously expressed concerns for bipolar disorder, but it appears less likely in the absence of typical  manic/hypomanic episodes which are consistent with  bipolar disorder.  - Pt also reports hx most likely consistent with OCD  Update on 04/17/22  - Returns to clinic after 1.5 month for follow up.  She appears to have continued overall stability with mood, anxiety and OCD, has been working almost full-time as well as studying at [employer].  Continues to use cannabis intermittently, denies any other substance abuse.  Because of her overall stability, recommending to continue with  current medications, encourage her to be in therapy, and she agrees to have her be on the waiting list here.      Plan: Plan reviewed on 04/17/22   # Mood, Anxiety (chronic, stable), OCD (chronic and stable) - Continue Prozac 60 mg daily.  - Continue with Remeron 15 mg QHS - Continue Atarax 25 mg QAM PRN for anxiety, 50 mg QHS, and continue atarax 25 mg PRN at noon,  - Continue Propranolol 10 mg BID.   Insomnia: - Continue Remeron 15 mg QHS - can take Atarax 25-50 mg QHS PRN    This note was generated in part or whole with voice recognition software. Voice recognition is usually quite accurate but there are transcription errors that can and very often do occur. I apologize for any typographical errors that were not detected and corrected.   MDM = 2 or more chronic stable conditions + med management       Gina Erm, MD 04/17/22  11:31 AM

## 2022-06-10 ENCOUNTER — Telehealth (INDEPENDENT_AMBULATORY_CARE_PROVIDER_SITE_OTHER): Payer: Medicaid Other | Admitting: Child and Adolescent Psychiatry

## 2022-06-10 DIAGNOSIS — F3341 Major depressive disorder, recurrent, in partial remission: Secondary | ICD-10-CM | POA: Diagnosis not present

## 2022-06-10 DIAGNOSIS — F418 Other specified anxiety disorders: Secondary | ICD-10-CM

## 2022-06-10 DIAGNOSIS — F422 Mixed obsessional thoughts and acts: Secondary | ICD-10-CM

## 2022-06-10 MED ORDER — HYDROXYZINE HCL 25 MG PO TABS: ORAL_TABLET | ORAL | 1 refills | Status: DC

## 2022-06-10 MED ORDER — PROPRANOLOL HCL 10 MG PO TABS
ORAL_TABLET | ORAL | 1 refills | Status: DC
Start: 2022-06-10 — End: 2022-07-23

## 2022-06-10 MED ORDER — HYDROXYZINE HCL 50 MG PO TABS: ORAL_TABLET | ORAL | 1 refills | Status: DC

## 2022-06-10 MED ORDER — MIRTAZAPINE 15 MG PO TABS: 15.0000 mg | ORAL_TABLET | Freq: Every day | ORAL | 1 refills | Status: DC

## 2022-06-10 MED ORDER — FLUOXETINE HCL 20 MG PO CAPS: 20.0000 mg | ORAL_CAPSULE | Freq: Every day | ORAL | 1 refills | Status: DC

## 2022-06-10 MED ORDER — FLUOXETINE HCL 40 MG PO CAPS: 40.0000 mg | ORAL_CAPSULE | Freq: Every day | ORAL | 1 refills | Status: DC

## 2022-06-10 NOTE — Progress Notes (Signed)
Virtual Visit via Video Note  I connected with Gina Rogers on 03/06/22 at 11:00 AM EDT by a video enabled telemedicine application and verified that I am speaking with the correct person using two identifiers.  Location: Patient: home Provider: office   I discussed the limitations of evaluation and management by telemedicine and the availability of in person appointments. The patient expressed understanding and agreed to proceed.    I discussed the assessment and treatment plan with the patient. The patient was provided an opportunity to ask questions and all were answered. The patient agreed with the plan and demonstrated an understanding of the instructions.   The patient was advised to call back or seek an in-person evaluation if the symptoms worsen or if the condition fails to improve as anticipated.   Darcel Smalling, MD     [facility] MD/PA/NP OP Progress Note 03/06/22  10:30 AM Suheily Kautz  MRN:  [id number]  Chief Complaint: Medication management follow-up for mood, anxiety, OCD and sleeping difficulties.  HPI: This is a 22 year old female in treatment for anxiety, OCD, sleeping difficulties, mood and also has history of trauma and multiple previous psychiatric hospitalizations in her early adolescent years.   Gina Rogers was seen and evaluated over telemedicine encounter for medication management follow-up.  She reports that she has been doing well, work has stayed consistent for her, she has continued to attend [employer] school of [employer].  She does report some ups and downs in her life, such as her neighbor with whom she has grown very close to has mood away a little far from her because she is opening a new group home and therefore she is not able to see her as much.  Her mother is also diagnosed with uterine cancer and will have hysterectomy this August.  She does however report that her mood has been "pretty good", denies any low lows or persistent sad mood.  She reports that she  continues to struggle with anxiety in the context of interacting with people at the work but she has been able to manage her anxiety fairly okay, sometimes takes breaks if she gets very overwhelmed and on rare occasion takes additional hydroxyzine.  She denies any SI or HI, denies any problems with sleep or appetite, denies anhedonia.  She says that she has continued to smoke about similar quantity of marijuana and denies any other substance use.  Reports drinking alcohol occasionally but denies any blackouts etc.  She says that she has been doing fairly okay with her OCD, continues to have some urges to follow certain rituals but because of her work she sometimes does not have the time to do it and she still has been able to manage it okay.  Because of her stability with her symptoms, discussed to continue with current medications and she reports full compliance with her medications.  Visit Diagnosis:    ICD-10-CM   1. Recurrent major depressive disorder, in partial remission  F33.41     2. Other specified anxiety disorders  F41.8 FLUoxetine (PROZAC) 40 MG capsule    FLUoxetine (PROZAC) 20 MG capsule    hydrOXYzine (ATARAX) 50 MG tablet    hydrOXYzine (ATARAX) 25 MG tablet    mirtazapine (REMERON) 15 MG tablet    propranolol (INDERAL) 10 MG tablet    3. Mixed obsessional thoughts and acts  F42.2             Past Psychiatric History: As mentioned in initial H&P, reviewed today, no change  Past Medical History:  Past Medical History:  Diagnosis Date   Anxiety    Asthma    Bipolar 1 disorder (HCC)    Bipolar and related disorder (HCC) 03/08/2016   Concussion    Depression    No past surgical history on file.  Family Psychiatric History: As mentioned in initial H&P, reviewed today, no change Family History:  Family History  Problem Relation Age of Onset   Anxiety disorder Maternal Uncle    Depression Maternal Uncle     Social History:  Social History   Socioeconomic  History   Marital status: Single    Spouse name: Not on file   Number of children: 0   Years of education: Not on file   Highest education level: 11th grade  Occupational History    Comment: part time   Tobacco Use   Smoking status: Never   Smokeless tobacco: Never  Vaping Use   Vaping Use: Some days   Substances: Nicotine  Substance and Sexual Activity   Alcohol use: No   Drug use: Never   Sexual activity: Never  Other Topics Concern   Not on file  Social History Narrative   Not on file   Social Determinants of Health   Financial Resource Strain: Low Risk  (01/22/2018)   Overall Financial Resource Strain (CARDIA)    Difficulty of Paying Living Expenses: Not hard at all  Food Insecurity: No Food Insecurity (01/22/2018)   Hunger Vital Sign    Worried About Running Out of Food in the Last Year: Never true    Ran Out of Food in the Last Year: Never true  Transportation Needs: No Transportation Needs (01/22/2018)   PRAPARE - Administrator, Civil Service (Medical): No    Lack of Transportation (Non-Medical): No  Physical Activity: Sufficiently Active (01/22/2018)   Exercise Vital Sign    Days of Exercise per Week: 5 days    Minutes of Exercise per Session: 50 min  Stress: Stress Concern Present (01/22/2018)   [employer] of Occupational Health - Occupational Stress Questionnaire    Feeling of Stress : Very much  Social Connections: Unknown (01/22/2018)   Social Connection and Isolation Panel [NHANES]    Frequency of Communication with Friends and Family: Not on file    Frequency of Social Gatherings with Friends and Family: Not on file    Attends Religious Services: Never    Active Member of Clubs or Organizations: No    Attends Banker Meetings: Never    Marital Status: Not on file    Allergies:  Allergies  Allergen Reactions   Adhesive [Tape]    Tapentadol    Ativan [Lorazepam] Other (See Comments)    Mother does not want pt to  receive - pt is not allergic.    Metabolic Disorder Labs: No results found for: "HGBA1C", "MPG" No results found for: "PROLACTIN" No results found for: "CHOL", "TRIG", "HDL", "CHOLHDL", "VLDL", "LDLCALC" No results found for: "TSH"  Therapeutic Level Labs: No results found for: "LITHIUM" No results found for: "VALPROATE" No results found for: "CBMZ"  Current Medications: Current Outpatient Medications  Medication Sig Dispense Refill   docusate sodium (COLACE) 100 MG capsule Take 2 capsules (200 mg total) by mouth 2 (two) times daily. 120 capsule 0   FLUoxetine (PROZAC) 20 MG capsule Take 1 capsule (20 mg total) by mouth daily. To be combined with Fluoxetine 40 mg daily. 30 capsule 1   FLUoxetine (PROZAC) 40 MG capsule  Take 1 capsule (40 mg total) by mouth daily. 30 capsule 1   hydrOXYzine (ATARAX) 25 MG tablet TAKE 1 TABLET BY MOUTH EVERY MORNING AND AT NOON 60 tablet 1   hydrOXYzine (ATARAX) 50 MG tablet TAKE 1 TABLET(50 MG) BY MOUTH AT BEDTIME 30 tablet 1   mirtazapine (REMERON) 15 MG tablet Take 1 tablet (15 mg total) by mouth at bedtime. 30 tablet 1   PROAIR HFA 108 (90 Base) MCG/ACT inhaler   1   propranolol (INDERAL) 10 MG tablet TAKE 1 TABLET(10 MG) BY MOUTH TWICE DAILY 60 tablet 1   RETIN-A 0.1 % cream APP A PEA SIZED AMT TO DRY FACE NIGHTLY  3   witch hazel-glycerin (TUCKS) pad Apply 1 application topically as needed for itching. 40 each 12   No current facility-administered medications for this visit.     Musculoskeletal:  Gait & Station: unable to assess since visit was over the telemedicine. Patient leans: N/A  Psychiatric Specialty Exam: ROSReview of 12 systems negative except as mentioned in HPI  unknown if currently breastfeeding.There is no height or weight on file to calculate BMI.   Mental Status Exam: Appearance: casually dressed; well groomed; no overt signs of trauma or distress noted Attitude: calm, cooperative with good eye contact Activity: No  PMA/PMR, no tics/no tremors; no EPS noted  Speech: normal rate, rhythm and volume Thought Process: Logical, linear, and goal-directed.  Associations: no looseness, tangentiality, circumstantiality, flight of ideas, thought blocking or word salad noted Thought Content: (abnormal/psychotic thoughts): no abnormal or delusional thought process evidenced SI/HI: denies Si/Hi Perception: no illusions or visual/auditory hallucinations noted; no response to internal stimuli demonstrated Mood & Affect: "good"/full range, neutral Judgment & Insight: both fair Attention and Concentration : Good Cognition : WNL Language : Good ADL - Intact  Screenings: AIMS    Flowsheet Row [facility]  AIMS Total Score 0      AUDIT    Flowsheet Row Ad[facility]  Alcohol Use Disorder Identification Test Final Score (AUDIT) 0       Synopsis: - Gina Rogers biracial female, 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, previously Archivist at [employer] with medical history significant of bronchial asthma and migraine, and psychiatric history significant of multiple previous psychiatric hospitalizations, previous suicide attempts and carries psychiatric diagnoses of major depressive disorder, anxiety disorders and bipolar disorder referred by her therapist for psychiatric medication management due to transportation problems because pt's last provider was at [facility] in [place] in 2019   Assessment and Plan:   - Pt's presentation appears most consistent with generalized and social anxiety disorder, with recurrent depression. Mother had previously expressed concerns for bipolar disorder, but it appears less likely in the absence of typical manic/hypomanic episodes which are consistent with  bipolar disorder.  - Pt also reports hx most likely  consistent with OCD  Update on 06/10/22  - Returns to clinic after 1.5 month for follow up.  She appears to have continued stability with mood, anxiety and OCD, has been working, saving money to get a car and be more independent.  Continues to smoke cannabis intermittently, denies other substance use.  Has been compliant with her medications and recommended to continue with them.  She was previously referred for therapy, now believes that her schedule is cannibal consistent and she can schedule therapy appointment and will call the clinic.  Plan: Plan reviewed on 06/10/22   # Mood, Anxiety (chronic, stable), OCD (chronic and stable) - Continue Prozac 60 mg daily.  - Continue with Remeron 15 mg QHS - Continue Atarax 25 mg QAM PRN for anxiety, 50 mg QHS, and continue atarax 25 mg PRN at noon,  - Continue Propranolol 10 mg BID.   Insomnia: - Continue Remeron 15 mg QHS - can take Atarax 25-50 mg QHS PRN    This note was generated in part or whole with voice recognition software. Voice recognition is usually quite accurate but there are transcription errors that can and very often do occur. I apologize for any typographical errors that were not detected and corrected.   MDM = 2 or more chronic stable conditions + med management       Darcel SmallingHiren M Cameryn Chrisley, MD 06/10/22  11:31 AM

## 2022-07-23 ENCOUNTER — Telehealth (INDEPENDENT_AMBULATORY_CARE_PROVIDER_SITE_OTHER): Payer: Medicaid Other | Admitting: Child and Adolescent Psychiatry

## 2022-07-23 DIAGNOSIS — F418 Other specified anxiety disorders: Secondary | ICD-10-CM

## 2022-07-23 DIAGNOSIS — F3341 Major depressive disorder, recurrent, in partial remission: Secondary | ICD-10-CM | POA: Diagnosis not present

## 2022-07-23 MED ORDER — HYDROXYZINE HCL 25 MG PO TABS
ORAL_TABLET | ORAL | 1 refills | Status: DC
Start: 2022-07-23 — End: 2022-09-13

## 2022-07-23 MED ORDER — FLUOXETINE HCL 20 MG PO CAPS
20.0000 mg | ORAL_CAPSULE | Freq: Every day | ORAL | 1 refills | Status: DC
Start: 2022-07-23 — End: 2022-09-13

## 2022-07-23 MED ORDER — FLUOXETINE HCL 40 MG PO CAPS
40.0000 mg | ORAL_CAPSULE | Freq: Every day | ORAL | 1 refills | Status: DC
Start: 2022-07-23 — End: 2022-09-13

## 2022-07-23 MED ORDER — MIRTAZAPINE 15 MG PO TABS
15.0000 mg | ORAL_TABLET | Freq: Every day | ORAL | 1 refills | Status: DC
Start: 2022-07-23 — End: 2022-09-13

## 2022-07-23 MED ORDER — HYDROXYZINE HCL 50 MG PO TABS
ORAL_TABLET | ORAL | 1 refills | Status: DC
Start: 2022-07-23 — End: 2022-09-13

## 2022-07-23 MED ORDER — PROPRANOLOL HCL 10 MG PO TABS: ORAL_TABLET | ORAL | 1 refills | Status: DC

## 2022-07-23 NOTE — Progress Notes (Signed)
Virtual Visit via Video Note  I connected with Gina Rogers on 07/23/22 at 11:00 AM EDT by a video enabled telemedicine application and verified that I am speaking with the correct person using two identifiers.  Location: Patient: home Provider: office   I discussed the limitations of evaluation and management by telemedicine and the availability of in person appointments. The patient expressed understanding and agreed to proceed.    I discussed the assessment and treatment plan with the patient. The patient was provided an opportunity to ask questions and all were answered. The patient agreed with the plan and demonstrated an understanding of the instructions.   The patient was advised to call back or seek an in-person evaluation if the symptoms worsen or if the condition fails to improve as anticipated.   Darcel Smalling, MD     [facility] MD/PA/NP OP Progress Note 07/23/22  11:00 AM Gina Rogers  MRN:  [id number]  Chief Complaint: Medication management follow-up for mood, anxiety, OCD and sleeping difficulties.  HPI: This is a 22 year old female in treatment for anxiety, OCD, sleeping difficulties, mood and also has history of trauma and multiple previous psychiatric hospitalizations in her early adolescent years.   She was seen and evaluated over telemedicine encounter for medication management follow-up.  She reports that recently she was fired from her job, her friend got shot, and they are moving.  She reports that with this new psychosocial stressors, she has been struggling more with her anxiety, and intermittently depressed in the context of thinking about her friend who got shot with whom she was very close to.  She also now has more financial stressors since she has lost the job.  She does still enjoy cooking, baking, continues to attend culinary school and looking forward to graduate next year in 2025.  She denies any SI or HI, continues to sleep fairly okay, denies problems with  appetite.she says that she continues to use marijuana on a daily basis but less quantity as compared to before.  She reports that she has stayed compliant with her medications, denies any side effects associated with it. Supportive counseling was provided, validated her feelings regarding the death of her friend, discussed grief process and encouraged her to make an appointment to establish therapy at this clinic.  We discussed that she is going through an adjustment reaction in the context of current psychosocial stressors and therefore recommending to continue with current medications and establish psychotherapy.  She verbalized understanding and agreed with this plan.  Visit Diagnosis:    ICD-10-CM   1. Recurrent major depressive disorder, in partial remission (HCC)  F33.41     2. Other specified anxiety disorders  F41.8 FLUoxetine (PROZAC) 40 MG capsule    FLUoxetine (PROZAC) 20 MG capsule    mirtazapine (REMERON) 15 MG tablet    propranolol (INDERAL) 10 MG tablet    hydrOXYzine (ATARAX) 25 MG tablet    hydrOXYzine (ATARAX) 50 MG tablet             Past Psychiatric History: As mentioned in initial H&P, reviewed today, no change   Past Medical History:  Past Medical History:  Diagnosis Date   Anxiety    Asthma    Bipolar 1 disorder (HCC)    Bipolar and related disorder (HCC) 03/08/2016   Concussion    Depression    No past surgical history on file.  Family Psychiatric History: As mentioned in initial H&P, reviewed today, no change Family History:  Family History  Problem Relation Age of Onset   Anxiety disorder Maternal Uncle    Depression Maternal Uncle     Social History:  Social History   Socioeconomic History   Marital status: Single    Spouse name: Not on file   Number of children: 0   Years of education: Not on file   Highest education level: 11th grade  Occupational History    Comment: part time   Tobacco Use   Smoking status: Never   Smokeless  tobacco: Never  Vaping Use   Vaping Use: Some days   Substances: Nicotine  Substance and Sexual Activity   Alcohol use: No   Drug use: Never   Sexual activity: Never  Other Topics Concern   Not on file  Social History Narrative   Not on file   Social Determinants of Health   Financial Resource Strain: Low Risk  (01/22/2018)   Overall Financial Resource Strain (CARDIA)    Difficulty of Paying Living Expenses: Not hard at all  Food Insecurity: No Food Insecurity (01/22/2018)   Hunger Vital Sign    Worried About Running Out of Food in the Last Year: Never true    Ran Out of Food in the Last Year: Never true  Transportation Needs: No Transportation Needs (01/22/2018)   PRAPARE - Administrator, Civil Service (Medical): No    Lack of Transportation (Non-Medical): No  Physical Activity: Sufficiently Active (01/22/2018)   Exercise Vital Sign    Days of Exercise per Week: 5 days    Minutes of Exercise per Session: 50 min  Stress: Stress Concern Present (01/22/2018)   [employer] of Occupational Health - Occupational Stress Questionnaire    Feeling of Stress : Very much  Social Connections: Unknown (01/22/2018)   Social Connection and Isolation Panel [NHANES]    Frequency of Communication with Friends and Family: Not on file    Frequency of Social Gatherings with Friends and Family: Not on file    Attends Religious Services: Never    Active Member of Clubs or Organizations: No    Attends Banker Meetings: Never    Marital Status: Not on file    Allergies:  Allergies  Allergen Reactions   Adhesive [Tape]    Tapentadol    Ativan [Lorazepam] Other (See Comments)    Mother does not want pt to receive - pt is not allergic.    Metabolic Disorder Labs: No results found for: "HGBA1C", "MPG" No results found for: "PROLACTIN" No results found for: "CHOL", "TRIG", "HDL", "CHOLHDL", "VLDL", "LDLCALC" No results found for: "TSH"  Therapeutic Level  Labs: No results found for: "LITHIUM" No results found for: "VALPROATE" No results found for: "CBMZ"  Current Medications: Current Outpatient Medications  Medication Sig Dispense Refill   docusate sodium (COLACE) 100 MG capsule Take 2 capsules (200 mg total) by mouth 2 (two) times daily. 120 capsule 0   FLUoxetine (PROZAC) 20 MG capsule Take 1 capsule (20 mg total) by mouth daily. To be combined with Fluoxetine 40 mg daily. 30 capsule 1   FLUoxetine (PROZAC) 40 MG capsule Take 1 capsule (40 mg total) by mouth daily. 30 capsule 1   hydrOXYzine (ATARAX) 25 MG tablet TAKE 1 TABLET BY MOUTH EVERY MORNING AND AT NOON 60 tablet 1   hydrOXYzine (ATARAX) 50 MG tablet TAKE 1 TABLET(50 MG) BY MOUTH AT BEDTIME 30 tablet 1   mirtazapine (REMERON) 15 MG tablet Take 1 tablet (15 mg total) by mouth at bedtime. 30  tablet 1   PROAIR HFA 108 (90 Base) MCG/ACT inhaler   1   propranolol (INDERAL) 10 MG tablet TAKE 1 TABLET(10 MG) BY MOUTH TWICE DAILY 60 tablet 1   RETIN-A 0.1 % cream APP A PEA SIZED AMT TO DRY FACE NIGHTLY  3   witch hazel-glycerin (TUCKS) pad Apply 1 application topically as needed for itching. 40 each 12   No current facility-administered medications for this visit.     Musculoskeletal:  Gait & Station: unable to assess since visit was over the telemedicine. Patient leans: N/A  Psychiatric Specialty Exam: ROSReview of 12 systems negative except as mentioned in HPI  unknown if currently breastfeeding.There is no height or weight on file to calculate BMI.   Mental Status Exam: Appearance: casually dressed; well groomed; no overt signs of trauma or distress noted Attitude: calm, cooperative with good eye contact Activity: No PMA/PMR, no tics/no tremors; no EPS noted  Speech: normal rate, rhythm and volume Thought Process: Logical, linear, and goal-directed.  Associations: no looseness, tangentiality, circumstantiality, flight of ideas, thought blocking or word salad noted Thought  Content: (abnormal/psychotic thoughts): no abnormal or delusional thought process evidenced SI/HI: denies Si/Hi Perception: no illusions or visual/auditory hallucinations noted; no response to internal stimuli demonstrated Mood & Affect: "good"/ tearful on occasion, overall full.  Judgment & Insight: both fair Attention and Concentration : Good Cognition : WNL Language : Good ADL - Intact   Screenings: AIMS    Flowsheet Row [facility]  AIMS Total Score 0      AUDIT    Flowsheet Row Ad[facility]  Alcohol Use Disorder Identification Test Final Score (AUDIT) 0       Synopsis: - 22 year-old biracial female, 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, previously Archivist at [employer] with medical history significant of bronchial asthma and migraine, and psychiatric history significant of multiple previous psychiatric hospitalizations, previous suicide attempts and carries psychiatric diagnoses of major depressive disorder, anxiety disorders and bipolar disorder referred by her therapist for psychiatric medication management due to transportation problems because pt's last provider was at [facility] in [place] in 2019   Assessment and Plan:   - Pt's presentation appears most consistent with generalized and social anxiety disorder, with recurrent depression. Mother had previously expressed concerns for bipolar disorder, but it appears less likely in the absence of typical manic/hypomanic episodes which are consistent with  bipolar disorder.  - Pt also reports hx most likely consistent with OCD  Update on 07/23/22  - Returns to clinic after 1.5 month for follow up.  She appears to have worsening of anxiety and mood in the context of recent psychosocial stressors and adjustment related to it, overall seems to be  able to manage anxiety and mood well, recommending to continue with current meds and establish outpatient psychotherapy.    Plan: Plan reviewed on 07/23/22   # Mood, Anxiety (chronic, stable), OCD (chronic and stable) - Continue Prozac 60 mg daily.  - Continue with Remeron 15 mg QHS - Continue Atarax 25 mg QAM PRN for anxiety, 50 mg QHS, and continue atarax 25 mg PRN at noon,  - Continue Propranolol 10 mg BID.   Insomnia: - Continue Remeron 15 mg QHS - can take Atarax 25-50 mg QHS PRN    This note was generated in part or whole with voice recognition software. Voice recognition is usually quite accurate  but there are transcription errors that can and very often do occur. I apologize for any typographical errors that were not detected and corrected.   MDM = 2 or more chronic stable conditions + med management       Darcel Smalling, MD 07/23/22  12:29 PM

## 2022-09-13 ENCOUNTER — Telehealth (INDEPENDENT_AMBULATORY_CARE_PROVIDER_SITE_OTHER): Payer: Medicaid Other | Admitting: Child and Adolescent Psychiatry

## 2022-09-13 DIAGNOSIS — G4709 Other insomnia: Secondary | ICD-10-CM

## 2022-09-13 DIAGNOSIS — F3341 Major depressive disorder, recurrent, in partial remission: Secondary | ICD-10-CM | POA: Diagnosis not present

## 2022-09-13 DIAGNOSIS — F418 Other specified anxiety disorders: Secondary | ICD-10-CM | POA: Diagnosis not present

## 2022-09-13 DIAGNOSIS — F422 Mixed obsessional thoughts and acts: Secondary | ICD-10-CM | POA: Diagnosis not present

## 2022-09-13 MED ORDER — FLUOXETINE HCL 20 MG PO CAPS: 20.0000 mg | ORAL_CAPSULE | Freq: Every day | ORAL | 1 refills | Status: DC

## 2022-09-13 MED ORDER — MIRTAZAPINE 15 MG PO TABS: 15.0000 mg | ORAL_TABLET | Freq: Every day | ORAL | 1 refills | Status: DC

## 2022-09-13 MED ORDER — FLUOXETINE HCL 40 MG PO CAPS
40.0000 mg | ORAL_CAPSULE | Freq: Every day | ORAL | 1 refills | Status: DC
Start: 2022-09-13 — End: 2022-11-07

## 2022-09-13 MED ORDER — HYDROXYZINE HCL 50 MG PO TABS
ORAL_TABLET | ORAL | 1 refills | Status: DC
Start: 2022-09-13 — End: 2022-11-07

## 2022-09-13 MED ORDER — HYDROXYZINE HCL 25 MG PO TABS
ORAL_TABLET | ORAL | 1 refills | Status: DC
Start: 2022-09-13 — End: 2022-11-07

## 2022-09-13 MED ORDER — PROPRANOLOL HCL 10 MG PO TABS
ORAL_TABLET | ORAL | 1 refills | Status: DC
Start: 2022-09-13 — End: 2022-11-07

## 2022-09-13 NOTE — Progress Notes (Signed)
Virtual Visit via Video Note  I connected with Gina Rogers on 09/13/22 at 11:00 AM EDT by a video enabled telemedicine application and verified that I am speaking with the correct person using two identifiers.  Location: Patient: home Provider: office   I discussed the limitations of evaluation and management by telemedicine and the availability of in person appointments. The patient expressed understanding and agreed to proceed.    I discussed the assessment and treatment plan with the patient. The patient was provided an opportunity to ask questions and all were answered. The patient agreed with the plan and demonstrated an understanding of the instructions.   The patient was advised to call back or seek an in-person evaluation if the symptoms worsen or if the condition fails to improve as anticipated.   Darcel Smalling, MD     [facility] MD/PA/NP OP Progress Note 09/13/22  11:00 AM Gina Rogers  MRN:  [id number]  Chief Complaint: Medication management follow-up for mood, anxiety, OCD and sleeping difficulties.  HPI: This is a 22 year old female in treatment for anxiety, OCD, sleeping difficulties, mood and also has history of trauma and multiple previous psychiatric hospitalizations in her early adolescent years.   She was seen and evaluated over telemedicine encounter for medication management follow-up.  She appeared calm, cooperative with much brighter affect as compared to previous appointments.  She says that she started working at [employer], part-time and really enjoys her work.  She says that it pays well and has very good benefits.  She also reports that she continues to attend culinary school and that has been going well.  They recently moved, and now has her own room which has been good.  She says that overall her mood has been "good", denies any low lows or depressed mood, denies anhedonia, recently spent a week with her friend in [place] and enjoyed her time there.  She denies any problems  with appetite or energy, does have some challenges with sleep but overall getting decent sleep.  She denies excessive worries or anxiety despite starting new work.  She also says that her OCD is manageable.  She has not used marijuana recently, and has not felt any different, except that she believes that  getting to sleep is harder.  She was encouraged to continue with abstinence from marijuana.  She denies any new concerns for today's appointment.  She says that she has been compliant with her medications and denies any side effects associated with it.  Recommended to continue with current medications and follow-up again in about 6 weeks or earlier if needed.  Visit Diagnosis:    ICD-10-CM   1. Recurrent major depressive disorder, in partial remission (HCC)  F33.41     2. Other specified anxiety disorders  F41.8 FLUoxetine (PROZAC) 20 MG capsule    FLUoxetine (PROZAC) 40 MG capsule    mirtazapine (REMERON) 15 MG tablet    hydrOXYzine (ATARAX) 25 MG tablet    hydrOXYzine (ATARAX) 50 MG tablet    propranolol (INDERAL) 10 MG tablet    3. Mixed obsessional thoughts and acts  F42.2     4. Other insomnia  G47.09               Past Psychiatric History: As mentioned in initial H&P, reviewed today, no change   Past Medical History:  Past Medical History:  Diagnosis Date   Anxiety    Asthma    Bipolar 1 disorder (HCC)    Bipolar and related disorder (HCC)  03/08/2016   Concussion    Depression    No past surgical history on file.  Family Psychiatric History: As mentioned in initial H&P, reviewed today, no change Family History:  Family History  Problem Relation Age of Onset   Anxiety disorder Maternal Uncle    Depression Maternal Uncle     Social History:  Social History   Socioeconomic History   Marital status: Single    Spouse name: Not on file   Number of children: 0   Years of education: Not on file   Highest education level: 11th grade  Occupational History     Comment: part time   Tobacco Use   Smoking status: Never   Smokeless tobacco: Never  Vaping Use   Vaping status: Some Days   Substances: Nicotine  Substance and Sexual Activity   Alcohol use: No   Drug use: Never   Sexual activity: Never  Other Topics Concern   Not on file  Social History Narrative   Not on file   Social Determinants of Health   Financial Resource Strain: Low Risk  (01/22/2018)   Overall Financial Resource Strain (CARDIA)    Difficulty of Paying Living Expenses: Not hard at all  Food Insecurity: No Food Insecurity (01/22/2018)   Hunger Vital Sign    Worried About Running Out of Food in the Last Year: Never true    Ran Out of Food in the Last Year: Never true  Transportation Needs: No Transportation Needs (01/22/2018)   PRAPARE - Administrator, Civil Service (Medical): No    Lack of Transportation (Non-Medical): No  Physical Activity: Sufficiently Active (01/22/2018)   Exercise Vital Sign    Days of Exercise per Week: 5 days    Minutes of Exercise per Session: 50 min  Stress: Stress Concern Present (01/22/2018)   [employer] of Occupational Health - Occupational Stress Questionnaire    Feeling of Stress : Very much  Social Connections: Unknown (01/22/2018)   Social Connection and Isolation Panel [NHANES]    Frequency of Communication with Friends and Family: Not on file    Frequency of Social Gatherings with Friends and Family: Not on file    Attends Religious Services: Never    Active Member of Clubs or Organizations: No    Attends Banker Meetings: Never    Marital Status: Not on file    Allergies:  Allergies  Allergen Reactions   Adhesive [Tape]    Tapentadol    Ativan [Lorazepam] Other (See Comments)    Mother does not want pt to receive - pt is not allergic.    Metabolic Disorder Labs: No results found for: "HGBA1C", "MPG" No results found for: "PROLACTIN" No results found for: "CHOL", "TRIG", "HDL",  "CHOLHDL", "VLDL", "LDLCALC" No results found for: "TSH"  Therapeutic Level Labs: No results found for: "LITHIUM" No results found for: "VALPROATE" No results found for: "CBMZ"  Current Medications: Current Outpatient Medications  Medication Sig Dispense Refill   docusate sodium (COLACE) 100 MG capsule Take 2 capsules (200 mg total) by mouth 2 (two) times daily. 120 capsule 0   FLUoxetine (PROZAC) 20 MG capsule Take 1 capsule (20 mg total) by mouth daily. To be combined with Fluoxetine 40 mg daily. 30 capsule 1   FLUoxetine (PROZAC) 40 MG capsule Take 1 capsule (40 mg total) by mouth daily. 30 capsule 1   hydrOXYzine (ATARAX) 25 MG tablet TAKE 1 TABLET BY MOUTH EVERY MORNING AND AT NOON 60 tablet 1  hydrOXYzine (ATARAX) 50 MG tablet TAKE 1 TABLET(50 MG) BY MOUTH AT BEDTIME 30 tablet 1   mirtazapine (REMERON) 15 MG tablet Take 1 tablet (15 mg total) by mouth at bedtime. 30 tablet 1   PROAIR HFA 108 (90 Base) MCG/ACT inhaler   1   propranolol (INDERAL) 10 MG tablet TAKE 1 TABLET(10 MG) BY MOUTH TWICE DAILY 60 tablet 1   RETIN-A 0.1 % cream APP A PEA SIZED AMT TO DRY FACE NIGHTLY  3   witch hazel-glycerin (TUCKS) pad Apply 1 application topically as needed for itching. 40 each 12   No current facility-administered medications for this visit.     Musculoskeletal:  Gait & Station: unable to assess since visit was over the telemedicine. Patient leans: N/A  Psychiatric Specialty Exam: ROSReview of 12 systems negative except as mentioned in HPI  unknown if currently breastfeeding.There is no height or weight on file to calculate BMI.  Mental Status Exam: Appearance: casually dressed; well groomed; no overt signs of trauma or distress noted Attitude: calm, cooperative with good eye contact Activity: No PMA/PMR, no tics/no tremors; no EPS noted  Speech: normal rate, rhythm and volume Thought Process: Logical, linear, and goal-directed.  Associations: no looseness, tangentiality,  circumstantiality, flight of ideas, thought blocking or word salad noted Thought Content: (abnormal/psychotic thoughts): no abnormal or delusional thought process evidenced SI/HI: denies Si/Hi Perception: no illusions or visual/auditory hallucinations noted; no response to internal stimuli demonstrated Mood & Affect: "good"/full range, neutral Judgment & Insight: both fair Attention and Concentration : Good Cognition : WNL Language : Good ADL - Intact   Screenings: AIMS    Flowsheet Row [facility]  AIMS Total Score 0      AUDIT    Flowsheet Row [facility]  Alcohol Use Disorder Identification Test Final Score (AUDIT) 0       Synopsis: - 22 year-old biracial female, 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, 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 Mother had previously expressed concerns for bipolar disorder, but it appears less likely in the absence of typical manic/hypomanic episodes which are consistent with  bipolar disorder.  - Pt also reports hx most likely consistent with OCD  Update on 09/13/22  - Returns to clinic after 1.5 month for follow up.  She appears to have improvement with her mood and anxiety, now working part-time and  likes her new job.  Denies new psychosocial stressors.  Recommending to continue with current medications because of improvement.     Plan: Plan reviewed on 09/13/22   # Mood, Anxiety (chronic, stable), OCD (chronic and stable) - Continue Prozac 60 mg daily.  - Continue with Remeron 15 mg QHS - Continue Atarax 25 mg QAM PRN for anxiety, 50 mg QHS, and continue atarax 25 mg PRN at noon,  - Continue Propranolol 10 mg BID.   Insomnia: - Continue Remeron 15 mg QHS - can take Atarax 25-50 mg QHS PRN  This note was generated in part or whole with voice recognition software. Voice recognition is usually quite accurate but there are transcription errors that can and very often do occur. I apologize for any typographical errors that were not detected and corrected.   MDM = 2 or more chronic stable conditions + med management       Darcel Smalling, MD 09/13/22  11:26 AM

## 2022-11-07 ENCOUNTER — Telehealth (INDEPENDENT_AMBULATORY_CARE_PROVIDER_SITE_OTHER): Payer: Medicaid Other | Admitting: Child and Adolescent Psychiatry

## 2022-11-07 DIAGNOSIS — F422 Mixed obsessional thoughts and acts: Secondary | ICD-10-CM

## 2022-11-07 DIAGNOSIS — F3341 Major depressive disorder, recurrent, in partial remission: Secondary | ICD-10-CM

## 2022-11-07 DIAGNOSIS — F418 Other specified anxiety disorders: Secondary | ICD-10-CM | POA: Diagnosis not present

## 2022-11-07 MED ORDER — FLUOXETINE HCL 20 MG PO CAPS
60.0000 mg | ORAL_CAPSULE | Freq: Every day | ORAL | 1 refills | Status: DC
Start: 2022-11-07 — End: 2023-01-08

## 2022-11-07 MED ORDER — MIRTAZAPINE 15 MG PO TABS
15.0000 mg | ORAL_TABLET | Freq: Every day | ORAL | 1 refills | Status: DC
Start: 2022-11-07 — End: 2023-01-08

## 2022-11-07 MED ORDER — PROPRANOLOL HCL 10 MG PO TABS
ORAL_TABLET | ORAL | 1 refills | Status: DC
Start: 2022-11-07 — End: 2023-01-08

## 2022-11-07 MED ORDER — HYDROXYZINE HCL 25 MG PO TABS
ORAL_TABLET | ORAL | 1 refills | Status: DC
Start: 2022-11-07 — End: 2023-01-08

## 2022-11-07 MED ORDER — HYDROXYZINE HCL 50 MG PO TABS
ORAL_TABLET | ORAL | 1 refills | Status: DC
Start: 2022-11-07 — End: 2023-01-08

## 2022-11-07 NOTE — Progress Notes (Signed)
Virtual Visit via Video Note  I connected with Gina Rogers on 11/07/22 at 11:00 AM EDT by a video enabled telemedicine application and verified that I am speaking with the correct person using two identifiers.  Location: Patient: home Provider: office   I discussed the limitations of evaluation and management by telemedicine and the availability of in person appointments. The patient expressed understanding and agreed to proceed.    I discussed the assessment and treatment plan with the patient. The patient was provided an opportunity to ask questions and all were answered. The patient agreed with the plan and demonstrated an understanding of the instructions.   The patient was advised to call back or seek an in-person evaluation if the symptoms worsen or if the condition fails to improve as anticipated.   Darcel Smalling, MD     [facility] MD/PA/NP OP Progress Note 11/07/22  11:00 AM Gina Rogers  MRN:  [id number]  Chief Complaint: Medication management follow-up for mood, anxiety, OCD and sleeping difficulties.  HPI: This is a 22 year old female in treatment for anxiety, OCD, sleeping difficulties, mood and also has history of trauma and multiple previous psychiatric hospitalizations in her early adolescent years.   She was seen and evaluated over telemedicine encounter for medication management follow-up.  She appeared calm, cooperative and pleasant during the evaluation.  She reported that she has been doing well, continues to work at [employer] part-time as well as attending Arts development [employer].  She reported that both of this has been going well, does have some anxiety related to finances to support her schooling.  She reported that she enjoys her work and school.  She denied any wallows or depressive episodes recently, denied any SI or HI.  Does have some difficulties with sleep especially since she has not smoked marijuana recently.  We also discussed that she has changed her routine and that  could impact her sleep and she does report that with the new routine change in the work, she is slowly getting adjusted to it.  She reported her OCD is stable, she is able to challenge her thoughts about cleanliness and that has been helpful.  She reported things are going well enough with her mother.  She has not able to fill up Prozac 40 mg to combine it with 20 mg for the past 2 weeks and therefore only has been taking Prozac 20 mg 2 pills a day.  We discussed a change to Prozac ordered to 20 mg 3 pills a day rather than combining it with 40 mg.  She verbalized understanding.  We discussed to continue with current medications because of the stability of her symptoms and follow-up again in about 2 months or earlier if needed.  Visit Diagnosis:    ICD-10-CM   1. Recurrent major depressive disorder, in partial remission (HCC)  F33.41     2. Other specified anxiety disorders  F41.8 FLUoxetine (PROZAC) 20 MG capsule    hydrOXYzine (ATARAX) 50 MG tablet    hydrOXYzine (ATARAX) 25 MG tablet    mirtazapine (REMERON) 15 MG tablet    propranolol (INDERAL) 10 MG tablet    3. Mixed obsessional thoughts and acts  F42.2                Past Psychiatric History: As mentioned in initial H&P, reviewed today, no change   Past Medical History:  Past Medical History:  Diagnosis Date   Anxiety    Asthma    Bipolar 1 disorder (HCC)  Bipolar and related disorder (HCC) 03/08/2016   Concussion    Depression    No past surgical history on file.  Family Psychiatric History: As mentioned in initial H&P, reviewed today, no change Family History:  Family History  Problem Relation Age of Onset   Anxiety disorder Maternal Uncle    Depression Maternal Uncle     Social History:  Social History   Socioeconomic History   Marital status: Single    Spouse name: Not on file   Number of children: 0   Years of education: Not on file   Highest education level: 11th grade  Occupational History     Comment: part time   Tobacco Use   Smoking status: Never   Smokeless tobacco: Never  Vaping Use   Vaping status: Some Days   Substances: Nicotine  Substance and Sexual Activity   Alcohol use: No   Drug use: Never   Sexual activity: Never  Other Topics Concern   Not on file  Social History Narrative   Not on file   Social Determinants of Health   Financial Resource Strain: Low Risk  (01/22/2018)   Overall Financial Resource Strain (CARDIA)    Difficulty of Paying Living Expenses: Not hard at all  Food Insecurity: No Food Insecurity (01/22/2018)   Hunger Vital Sign    Worried About Running Out of Food in the Last Year: Never true    Ran Out of Food in the Last Year: Never true  Transportation Needs: No Transportation Needs (01/22/2018)   PRAPARE - Administrator, Civil Service (Medical): No    Lack of Transportation (Non-Medical): No  Physical Activity: Sufficiently Active (01/22/2018)   Exercise Vital Sign    Days of Exercise per Week: 5 days    Minutes of Exercise per Session: 50 min  Stress: Stress Concern Present (01/22/2018)   [employer] of Occupational Health - Occupational Stress Questionnaire    Feeling of Stress : Very much  Social Connections: Unknown (01/22/2018)   Social Connection and Isolation Panel [NHANES]    Frequency of Communication with Friends and Family: Not on file    Frequency of Social Gatherings with Friends and Family: Not on file    Attends Religious Services: Never    Active Member of Clubs or Organizations: No    Attends Banker Meetings: Never    Marital Status: Not on file    Allergies:  Allergies  Allergen Reactions   Adhesive [Tape]    Tapentadol    Ativan [Lorazepam] Other (See Comments)    Mother does not want pt to receive - pt is not allergic.    Metabolic Disorder Labs: No results found for: "HGBA1C", "MPG" No results found for: "PROLACTIN" No results found for: "CHOL", "TRIG", "HDL",  "CHOLHDL", "VLDL", "LDLCALC" No results found for: "TSH"  Therapeutic Level Labs: No results found for: "LITHIUM" No results found for: "VALPROATE" No results found for: "CBMZ"  Current Medications: Current Outpatient Medications  Medication Sig Dispense Refill   docusate sodium (COLACE) 100 MG capsule Take 2 capsules (200 mg total) by mouth 2 (two) times daily. 120 capsule 0   FLUoxetine (PROZAC) 20 MG capsule Take 3 capsules (60 mg total) by mouth daily. 90 capsule 1   hydrOXYzine (ATARAX) 25 MG tablet TAKE 1 TABLET BY MOUTH EVERY MORNING AND AT NOON 60 tablet 1   hydrOXYzine (ATARAX) 50 MG tablet TAKE 1 TABLET(50 MG) BY MOUTH AT BEDTIME 30 tablet 1   mirtazapine (REMERON)  15 MG tablet Take 1 tablet (15 mg total) by mouth at bedtime. 30 tablet 1   PROAIR HFA 108 (90 Base) MCG/ACT inhaler   1   propranolol (INDERAL) 10 MG tablet TAKE 1 TABLET(10 MG) BY MOUTH TWICE DAILY 60 tablet 1   RETIN-A 0.1 % cream APP A PEA SIZED AMT TO DRY FACE NIGHTLY  3   witch hazel-glycerin (TUCKS) pad Apply 1 application topically as needed for itching. 40 each 12   No current facility-administered medications for this visit.     Musculoskeletal:  Gait & Station: unable to assess since visit was over the telemedicine. Patient leans: N/A  Psychiatric Specialty Exam: ROSReview of 12 systems negative except as mentioned in HPI  unknown if currently breastfeeding.There is no height or weight on file to calculate BMI.  Mental Status Exam: Appearance: casually dressed; well groomed; no overt signs of trauma or distress noted Attitude: calm, cooperative with good eye contact Activity: No PMA/PMR, no tics/no tremors; no EPS noted  Speech: normal rate, rhythm and volume Thought Process: Logical, linear, and goal-directed.  Associations: no looseness, tangentiality, circumstantiality, flight of ideas, thought blocking or word salad noted Thought Content: (abnormal/psychotic thoughts): no abnormal or  delusional thought process evidenced SI/HI: denies Si/Hi Perception: no illusions or visual/auditory hallucinations noted; no response to internal stimuli demonstrated Mood & Affect: "good"/full range Judgment & Insight: both fair Attention and Concentration : Good Cognition : WNL Language : Good ADL - Intact   Screenings: AIMS    Flowsheet Row [facility]  AIMS Total Score 0      AUDIT    Flowsheet Row Ad[facility] ([facility]  Alcohol Use Disorder Identification Test Final Score (AUDIT) 0       Synopsis: - 23 year-old biracial female, 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, previously Archivist at [employer] with medical history significant of bronchial asthma and migraine, and psychiatric history significant of multiple previous psychiatric hospitalizations, previous suicide attempts and carries psychiatric diagnoses of major depressive disorder, anxiety disorders and bipolar disorder referred by her therapist for psychiatric medication management due to transportation problems because pt's last provider was at [facility] in [place] in 2019   Assessment and Plan:   - Pt's presentation appears most consistent with generalized and social anxiety disorder, with recurrent depression. Mother had previously expressed concerns for bipolar disorder, but it appears less likely in the absence of typical manic/hypomanic episodes which are consistent with  bipolar disorder.  - Pt also reports hx most likely consistent with OCD  Update on 11/07/22  - Returns to clinic after 1.5 month for follow up.  She appears to have currently instability with her mood and anxiety.  Recommending to continue with current medications because of the stability with her symptoms and follow-up in about 2 months or earlier if needed.    Plan: Plan  reviewed on 11/07/22   # Mood, Anxiety (chronic, stable), OCD (chronic and stable) - Continue Prozac 60 mg daily.  - Continue with Remeron 15 mg QHS - Continue Atarax 25 mg QAM PRN for anxiety, 50 mg QHS, and continue atarax 25 mg PRN at noon,  - Continue Propranolol 10 mg BID.   Insomnia: - Continue Remeron 15 mg QHS - can take Atarax 25-50 mg QHS PRN    This note was generated in part or whole with voice recognition software. Voice recognition is usually quite  accurate but there are transcription errors that can and very often do occur. I apologize for any typographical errors that were not detected and corrected.   MDM = 2 or more chronic stable conditions + med management       Darcel Smalling, MD 11/07/22  11:35 AM

## 2023-01-08 ENCOUNTER — Telehealth (INDEPENDENT_AMBULATORY_CARE_PROVIDER_SITE_OTHER): Payer: Medicaid Other | Admitting: Child and Adolescent Psychiatry

## 2023-01-08 DIAGNOSIS — F418 Other specified anxiety disorders: Secondary | ICD-10-CM | POA: Diagnosis not present

## 2023-01-08 DIAGNOSIS — F3341 Major depressive disorder, recurrent, in partial remission: Secondary | ICD-10-CM | POA: Diagnosis not present

## 2023-01-08 DIAGNOSIS — F422 Mixed obsessional thoughts and acts: Secondary | ICD-10-CM | POA: Diagnosis not present

## 2023-01-08 MED ORDER — HYDROXYZINE HCL 50 MG PO TABS
ORAL_TABLET | ORAL | 1 refills | Status: DC
Start: 2023-01-08 — End: 2023-03-24

## 2023-01-08 MED ORDER — HYDROXYZINE HCL 25 MG PO TABS
ORAL_TABLET | ORAL | 1 refills | Status: DC
Start: 2023-01-08 — End: 2023-03-24

## 2023-01-08 MED ORDER — PROPRANOLOL HCL 10 MG PO TABS: ORAL_TABLET | ORAL | 2 refills | Status: DC

## 2023-01-08 MED ORDER — MIRTAZAPINE 15 MG PO TABS: 15.0000 mg | ORAL_TABLET | Freq: Every day | ORAL | 2 refills | Status: DC

## 2023-01-08 MED ORDER — FLUOXETINE HCL 20 MG PO CAPS
60.0000 mg | ORAL_CAPSULE | Freq: Every day | ORAL | 2 refills | Status: DC
Start: 2023-01-08 — End: 2023-03-24

## 2023-01-08 NOTE — Progress Notes (Signed)
Virtual Visit via Video Note  I connected with Gina Rogers on 01/08/23 at 11:00 AM EST by a video enabled telemedicine application and verified that I am speaking with the correct person using two identifiers.  Location: Patient: home Provider: office   I discussed the limitations of evaluation and management by telemedicine and the availability of in person appointments. The patient expressed understanding and agreed to proceed.    I discussed the assessment and treatment plan with the patient. The patient was provided an opportunity to ask questions and all were answered. The patient agreed with the plan and demonstrated an understanding of the instructions.   The patient was advised to call back or seek an in-person evaluation if the symptoms worsen or if the condition fails to improve as anticipated.   Darcel Smalling, MD     [facility] MD/PA/NP OP Progress Note 01/08/23  11:00 AM Gina Rogers  MRN:  [id number]  Chief Complaint: Medication management follow-up for mood, anxiety, OCD and sleeping difficulties.  HPI: This is a 22 year old female in treatment for anxiety, OCD, sleeping difficulties, mood and also has history of trauma and multiple previous psychiatric hospitalizations in her early adolescent years.   She was seen and evaluated over telemedicine encounter for medication management follow-up.  She appeared calm, cooperative and pleasant during the evaluation.  She reported that she has been doing "pretty good", continues to work part-time at [employer], work has been going well for her and she is still also attending Arts development officer.  She enjoys that as well.  She denied any low dose of depressed mood, denied excessive worries or anxiety, overall reported that her mood and anxiety is stable.  She has been having some challenges with going to sleep and staying asleep however it appears to be in the context of poor sleep routine which she agreed to work on.  She has not been taking  hydroxyzine at night and will start taking hydroxyzine at night as well.  She denied SI or HI, denied problems with appetite or energy.  She reported taking medications as prescribed without side effects.  She reported that she has been able to control her intrusive thoughts regarding handwashing fairly well.  He denied any new psychosocial stressors at home.  She reported that she continues to smoke marijuana about once every day, denied any other substance abuse, drinks about 2 drinks on the weekends.  We discussed to continue with current medications because of the stability with her symptoms and follow-up again in about 2 months or earlier if needed.   Visit Diagnosis:    ICD-10-CM   1. Recurrent major depressive disorder, in partial remission (HCC)  F33.41     2. Other specified anxiety disorders  F41.8 FLUoxetine (PROZAC) 20 MG capsule    hydrOXYzine (ATARAX) 50 MG tablet    hydrOXYzine (ATARAX) 25 MG tablet    mirtazapine (REMERON) 15 MG tablet    propranolol (INDERAL) 10 MG tablet    3. Mixed obsessional thoughts and acts  F42.2                 Past Psychiatric History: As mentioned in initial H&P, reviewed today, no change   Past Medical History:  Past Medical History:  Diagnosis Date   Anxiety    Asthma    Bipolar 1 disorder (HCC)    Bipolar and related disorder (HCC) 03/08/2016   Concussion    Depression    No past surgical history on file.  Family  Psychiatric History: As mentioned in initial H&P, reviewed today, no change Family History:  Family History  Problem Relation Age of Onset   Anxiety disorder Maternal Uncle    Depression Maternal Uncle     Social History:  Social History   Socioeconomic History   Marital status: Single    Spouse name: Not on file   Number of children: 0   Years of education: Not on file   Highest education level: 11th grade  Occupational History    Comment: part time   Tobacco Use   Smoking status: Never   Smokeless  tobacco: Never  Vaping Use   Vaping status: Some Days   Substances: Nicotine  Substance and Sexual Activity   Alcohol use: No   Drug use: Never   Sexual activity: Never  Other Topics Concern   Not on file  Social History Narrative   Not on file   Social Determinants of Health   Financial Resource Strain: Low Risk  (01/22/2018)   Overall Financial Resource Strain (CARDIA)    Difficulty of Paying Living Expenses: Not hard at all  Food Insecurity: No Food Insecurity (01/22/2018)   Hunger Vital Sign    Worried About Running Out of Food in the Last Year: Never true    Ran Out of Food in the Last Year: Never true  Transportation Needs: No Transportation Needs (01/22/2018)   PRAPARE - Administrator, Civil Service (Medical): No    Lack of Transportation (Non-Medical): No  Physical Activity: Sufficiently Active (01/22/2018)   Exercise Vital Sign    Days of Exercise per Week: 5 days    Minutes of Exercise per Session: 50 min  Stress: Stress Concern Present (01/22/2018)   [employer] of Occupational Health - Occupational Stress Questionnaire    Feeling of Stress : Very much  Social Connections: Unknown (01/22/2018)   Social Connection and Isolation Panel [NHANES]    Frequency of Communication with Friends and Family: Not on file    Frequency of Social Gatherings with Friends and Family: Not on file    Attends Religious Services: Never    Active Member of Clubs or Organizations: No    Attends Banker Meetings: Never    Marital Status: Not on file    Allergies:  Allergies  Allergen Reactions   Adhesive [Tape]    Tapentadol    Ativan [Lorazepam] Other (See Comments)    Mother does not want pt to receive - pt is not allergic.    Metabolic Disorder Labs: No results found for: "HGBA1C", "MPG" No results found for: "PROLACTIN" No results found for: "CHOL", "TRIG", "HDL", "CHOLHDL", "VLDL", "LDLCALC" No results found for: "TSH"  Therapeutic Level  Labs: No results found for: "LITHIUM" No results found for: "VALPROATE" No results found for: "CBMZ"  Current Medications: Current Outpatient Medications  Medication Sig Dispense Refill   docusate sodium (COLACE) 100 MG capsule Take 2 capsules (200 mg total) by mouth 2 (two) times daily. 120 capsule 0   FLUoxetine (PROZAC) 20 MG capsule Take 3 capsules (60 mg total) by mouth daily. 90 capsule 2   hydrOXYzine (ATARAX) 25 MG tablet TAKE 1 TABLET BY MOUTH EVERY MORNING AND AT NOON 60 tablet 1   hydrOXYzine (ATARAX) 50 MG tablet TAKE 1 TABLET(50 MG) BY MOUTH AT BEDTIME 30 tablet 1   mirtazapine (REMERON) 15 MG tablet Take 1 tablet (15 mg total) by mouth at bedtime. 30 tablet 2   PROAIR HFA 108 (90 Base) MCG/ACT  inhaler   1   propranolol (INDERAL) 10 MG tablet TAKE 1 TABLET(10 MG) BY MOUTH TWICE DAILY 60 tablet 2   RETIN-A 0.1 % cream APP A PEA SIZED AMT TO DRY FACE NIGHTLY  3   witch hazel-glycerin (TUCKS) pad Apply 1 application topically as needed for itching. 40 each 12   No current facility-administered medications for this visit.     Musculoskeletal:  Gait & Station: unable to assess since visit was over the telemedicine. Patient leans: N/A  Psychiatric Specialty Exam: ROSReview of 12 systems negative except as mentioned in HPI  unknown if currently breastfeeding.There is no height or weight on file to calculate BMI.  Mental Status Exam: Appearance: casually dressed; well groomed; no overt signs of trauma or distress noted Attitude: calm, cooperative with good eye contact Activity: No PMA/PMR, no tics/no tremors; no EPS noted  Speech: normal rate, rhythm and volume Thought Process: Logical, linear, and goal-directed.  Associations: no looseness, tangentiality, circumstantiality, flight of ideas, thought blocking or word salad noted Thought Content: (abnormal/psychotic thoughts): no abnormal or delusional thought process evidenced SI/HI: denies Si/Hi Perception: no illusions or  visual/auditory hallucinations noted; no response to internal stimuli demonstrated Mood & Affect: "good"/full range, neutral Judgment & Insight: both fair Attention and Concentration : Good Cognition : WNL Language : Good ADL - Intact   Screenings: AIMS    Flowsheet Row [facility]  AIMS Total Score 0      AUDIT    Flowsheet Row Ad[facility]  Alcohol Use Disorder Identification Test Final Score (AUDIT) 0       Synopsis: - 22 year-old biracial female, 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, 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 Mother had previously expressed concerns for bipolar disorder, but it appears less likely in the absence of typical manic/hypomanic episodes which are consistent with  bipolar disorder.  - Pt also reports hx most likely consistent with OCD  Update on 01/08/23  -she returns to clinic after 2 months, appears to have continued stability with mood and anxiety, recommending to continue with current medications and follow-up in about 2 to 3 months or earlier if needed.   Plan: Plan reviewed on 01/08/23   # Mood, Anxiety (chronic, stable), OCD (chronic and stable) - Continue Prozac 60 mg daily.  - Continue with  Remeron 15 mg QHS - Continue Atarax 25 mg QAM PRN for anxiety, 50 mg QHS, and continue atarax 25 mg PRN at noon,  - Continue Propranolol 10 mg BID.   Insomnia: - Continue Remeron 15 mg QHS - can take Atarax 25-50 mg QHS PRN    This note was generated in part or whole with voice recognition software. Voice recognition is usually quite accurate but there are transcription errors that can and very often do occur. I apologize for any typographical errors that were not detected and corrected.   MDM = 2 or more chronic stable conditions +  med management       Darcel Smalling, MD 01/08/23  11:21 AM

## 2023-02-13 ENCOUNTER — Ambulatory Visit (INDEPENDENT_AMBULATORY_CARE_PROVIDER_SITE_OTHER): Payer: Medicaid Other | Admitting: Professional Counselor

## 2023-02-13 DIAGNOSIS — Z91199 Patient's noncompliance with other medical treatment and regimen due to unspecified reason: Secondary | ICD-10-CM

## 2023-02-13 NOTE — Progress Notes (Signed)
Patient no-showed today's appointment; virtual appointment was for 02/13/23 at 8 AM to establish outpatient therapy services. Therapist sent link via text and email. Patient briefly connected but disconnected prior to therapist initiating session. Attempted to call patient and was unable to leave a VM.

## 2023-03-24 ENCOUNTER — Telehealth (INDEPENDENT_AMBULATORY_CARE_PROVIDER_SITE_OTHER): Payer: Medicaid Other | Admitting: Child and Adolescent Psychiatry

## 2023-03-24 DIAGNOSIS — F3341 Major depressive disorder, recurrent, in partial remission: Secondary | ICD-10-CM

## 2023-03-24 DIAGNOSIS — F422 Mixed obsessional thoughts and acts: Secondary | ICD-10-CM

## 2023-03-24 DIAGNOSIS — F418 Other specified anxiety disorders: Secondary | ICD-10-CM

## 2023-03-24 MED ORDER — MIRTAZAPINE 15 MG PO TABS: 15.0000 mg | ORAL_TABLET | Freq: Every day | ORAL | 2 refills | Status: DC

## 2023-03-24 MED ORDER — HYDROXYZINE HCL 50 MG PO TABS: ORAL_TABLET | ORAL | 1 refills | Status: DC

## 2023-03-24 MED ORDER — FLUOXETINE HCL 20 MG PO CAPS: 60.0000 mg | ORAL_CAPSULE | Freq: Every day | ORAL | 2 refills | Status: DC

## 2023-03-24 MED ORDER — HYDROXYZINE HCL 25 MG PO TABS: ORAL_TABLET | ORAL | 1 refills | Status: DC

## 2023-03-24 MED ORDER — PROPRANOLOL HCL 10 MG PO TABS: ORAL_TABLET | ORAL | 2 refills | Status: DC

## 2023-03-24 NOTE — Progress Notes (Signed)
Virtual Visit via Video Note  I connected with Gina Rogers on 03/24/23 at  4:00 PM EST by a video enabled telemedicine application and verified that I am speaking with the correct person using two identifiers.  Location: Patient: home Provider: office   I discussed the limitations of evaluation and management by telemedicine and the availability of in person appointments. The patient expressed understanding and agreed to proceed.    I discussed the assessment and treatment plan with the patient. The patient was provided an opportunity to ask questions and all were answered. The patient agreed with the plan and demonstrated an understanding of the instructions.   The patient was advised to call back or seek an in-person evaluation if the symptoms worsen or if the condition fails to improve as anticipated.   Darcel Smalling, MD     [facility] MD/PA/NP OP Progress Note 03/24/23  4:00 PM Gina Rogers  MRN:  [id number]  Chief Complaint: Medication management follow-up for mood, anxiety, OCD and sleeping difficulties.  HPI: This is a 23 year old female in treatment for anxiety, OCD, sleeping difficulties, mood and also has history of trauma and multiple previous psychiatric hospitalizations in her early adolescent years.   She was seen and evaluated over telemedicine encounter for medication management follow-up.  She appeared calm, cooperative and pleasant during the evaluation.  She reported that she has been doing "pretty good", continues to work part-time at [employer] and attending Arts development officer.  She reported that work has been "very good", and she continues to enjoy learning new Metallurgist.  She reported that her mood has been "good", denied any low lows or depressive episodes recently, denied excessive worries or anxiety, described her mood as "relaxed", has been sleeping well, has decent energy, denied SI or HI.  She reported that she has been doing okay with her mother at home,  denied any additional psychosocial stressors.  She reported that she continues to smoke marijuana on a daily basis, drinks about 2 drinks every weekend, denied any blackouts or withdrawals.  She reported that she has been consistently taking medications and denied any side effects associated with it.  She made appointment with therapist however missed, she will call at the front desk to reschedule that appointment.  We discussed to continue with current medications because of the stability with her symptoms and follow-up again in about 2-3 months or earlier if needed.  Visit Diagnosis:    ICD-10-CM   1. Other specified anxiety disorders  F41.8 mirtazapine (REMERON) 15 MG tablet    propranolol (INDERAL) 10 MG tablet    FLUoxetine (PROZAC) 20 MG capsule    hydrOXYzine (ATARAX) 50 MG tablet    hydrOXYzine (ATARAX) 25 MG tablet    2. Recurrent major depressive disorder, in partial remission (HCC)  F33.41     3. Mixed obsessional thoughts and acts  F42.2       Past Psychiatric History: As mentioned in initial H&P, reviewed today, no change   Past Medical History:  Past Medical History:  Diagnosis Date   Anxiety    Asthma    Bipolar 1 disorder (HCC)    Bipolar and related disorder (HCC) 03/08/2016   Concussion    Depression    No past surgical history on file.  Family Psychiatric History: As mentioned in initial H&P, reviewed today, no change Family History:  Family History  Problem Relation Age of Onset   Anxiety disorder Maternal Uncle    Depression Maternal Uncle  Social History:  Social History   Socioeconomic History   Marital status: Single    Spouse name: Not on file   Number of children: 0   Years of education: Not on file   Highest education level: 11th grade  Occupational History    Comment: part time   Tobacco Use   Smoking status: Never   Smokeless tobacco: Never  Vaping Use   Vaping status: Some Days   Substances: Nicotine  Substance and Sexual Activity    Alcohol use: No   Drug use: Never   Sexual activity: Never  Other Topics Concern   Not on file  Social History Narrative   Not on file   Social Drivers of Health   Financial Resource Strain: Low Risk  (01/22/2018)   Overall Financial Resource Strain (CARDIA)    Difficulty of Paying Living Expenses: Not hard at all  Food Insecurity: No Food Insecurity (01/22/2018)   Hunger Vital Sign    Worried About Running Out of Food in the Last Year: Never true    Ran Out of Food in the Last Year: Never true  Transportation Needs: No Transportation Needs (01/22/2018)   PRAPARE - Administrator, Civil Service (Medical): No    Lack of Transportation (Non-Medical): No  Physical Activity: Sufficiently Active (01/22/2018)   Exercise Vital Sign    Days of Exercise per Week: 5 days    Minutes of Exercise per Session: 50 min  Stress: Stress Concern Present (01/22/2018)   [employer] of Occupational Health - Occupational Stress Questionnaire    Feeling of Stress : Very much  Social Connections: Unknown (01/22/2018)   Social Connection and Isolation Panel [NHANES]    Frequency of Communication with Friends and Family: Not on file    Frequency of Social Gatherings with Friends and Family: Not on file    Attends Religious Services: Never    Active Member of Clubs or Organizations: No    Attends Banker Meetings: Never    Marital Status: Not on file    Allergies:  Allergies  Allergen Reactions   Adhesive [Tape]    Tapentadol    Ativan [Lorazepam] Other (See Comments)    Mother does not want pt to receive - pt is not allergic.    Metabolic Disorder Labs: No results found for: "HGBA1C", "MPG" No results found for: "PROLACTIN" No results found for: "CHOL", "TRIG", "HDL", "CHOLHDL", "VLDL", "LDLCALC" No results found for: "TSH"  Therapeutic Level Labs: No results found for: "LITHIUM" No results found for: "VALPROATE" No results found for: "CBMZ"  Current  Medications: Current Outpatient Medications  Medication Sig Dispense Refill   docusate sodium (COLACE) 100 MG capsule Take 2 capsules (200 mg total) by mouth 2 (two) times daily. 120 capsule 0   FLUoxetine (PROZAC) 20 MG capsule Take 3 capsules (60 mg total) by mouth daily. 90 capsule 2   hydrOXYzine (ATARAX) 25 MG tablet TAKE 1 TABLET BY MOUTH EVERY MORNING AND AT NOON 60 tablet 1   hydrOXYzine (ATARAX) 50 MG tablet TAKE 1 TABLET(50 MG) BY MOUTH AT BEDTIME 30 tablet 1   mirtazapine (REMERON) 15 MG tablet Take 1 tablet (15 mg total) by mouth at bedtime. 30 tablet 2   PROAIR HFA 108 (90 Base) MCG/ACT inhaler   1   propranolol (INDERAL) 10 MG tablet TAKE 1 TABLET(10 MG) BY MOUTH TWICE DAILY 60 tablet 2   RETIN-A 0.1 % cream APP A PEA SIZED AMT TO DRY FACE NIGHTLY  3   witch hazel-glycerin (TUCKS) pad Apply 1 application topically as needed for itching. 40 each 12   No current facility-administered medications for this visit.     Musculoskeletal:  Gait & Station: unable to assess since visit was over the telemedicine. Patient leans: N/A  Psychiatric Specialty Exam: ROSReview of 12 systems negative except as mentioned in HPI  unknown if currently breastfeeding.There is no height or weight on file to calculate BMI.  Mental Status Exam: Appearance: casually dressed; well groomed; no overt signs of trauma or distress noted Attitude: calm, cooperative with good eye contact Activity: No PMA/PMR, no tics/no tremors; no EPS noted  Speech: normal rate, rhythm and volume Thought Process: Logical, linear, and goal-directed.  Associations: no looseness, tangentiality, circumstantiality, flight of ideas, thought blocking or word salad noted Thought Content: (abnormal/psychotic thoughts): no abnormal or delusional thought process evidenced SI/HI: denies Si/Hi Perception: no illusions or visual/auditory hallucinations noted; no response to internal stimuli demonstrated Mood & Affect: "good"/full  range, neutral Judgment & Insight: both fair Attention and Concentration : Good Cognition : WNL Language : Good ADL - Intact   Screenings: AIMS    Flowsheet Row [facility]  AIMS Total Score 0      AUDIT    Flowsheet Row Ad[facility]  Alcohol Use Disorder Identification Test Final Score (AUDIT) 0       Synopsis: - 23 year-old biracial female, 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, previously Archivist at [employer] with medical history significant of bronchial asthma and migraine, and psychiatric history significant of multiple previous psychiatric hospitalizations, previous suicide attempts and carries psychiatric diagnoses of major depressive disorder, anxiety disorders and bipolar disorder referred by her therapist for psychiatric medication management due to transportation problems because pt's last provider was at [facility] in [place] in 2019   Assessment and Plan:   - Pt's presentation is most consistent with generalized and social anxiety disorder, with recurrent depression. Mother had previously expressed concerns for bipolar disorder, but it seemed less likely in the absence of typical manic/hypomanic episodes which are consistent with  bipolar disorder.  - Pt also reports hx most likely consistent with OCD  Update on 03/24/23  - she returns to clinic after 2 months, appears to have continued stability with mood and anxiety, recommending to continue with current medications and follow-up in about 2 or 3 months or earlier if needed.    Plan: Plan reviewed on 03/24/23   # Mood, Anxiety (chronic, stable), OCD (chronic and stable) - Continue Prozac 60 mg daily.  - Continue with Remeron 15 mg QHS - Continue Atarax 25 mg QAM PRN for anxiety, 50 mg QHS, and continue atarax 25 mg PRN at noon,  -  Continue Propranolol 10 mg BID.   Insomnia: - Continue Remeron 15 mg QHS - can take Atarax 25-50 mg QHS PRN    This note was generated in part or whole with voice recognition software. Voice recognition is usually quite accurate but there are transcription errors that can and very often do occur. I apologize for any typographical errors that were not detected and corrected.        Darcel Smalling, MD 03/24/23  4:28 PM

## 2023-06-11 ENCOUNTER — Telehealth: Payer: Self-pay | Admitting: Child and Adolescent Psychiatry

## 2023-06-19 ENCOUNTER — Telehealth (INDEPENDENT_AMBULATORY_CARE_PROVIDER_SITE_OTHER): Admitting: Child and Adolescent Psychiatry

## 2023-06-19 DIAGNOSIS — F3341 Major depressive disorder, recurrent, in partial remission: Secondary | ICD-10-CM

## 2023-06-19 DIAGNOSIS — F418 Other specified anxiety disorders: Secondary | ICD-10-CM | POA: Diagnosis not present

## 2023-06-19 NOTE — Progress Notes (Signed)
 Virtual Visit via Video Note  I connected with Gina Rogers on 06/19/23 at  2:30 PM EDT by a video enabled telemedicine application and verified that I am speaking with the correct person using two identifiers.  Location: Patient: home Provider: office   I discussed the limitations of evaluation and management by telemedicine and the availability of in person appointments. The patient expressed understanding and agreed to proceed.    I discussed the assessment and treatment plan with the patient. The patient was provided an opportunity to ask questions and all were answered. The patient agreed with the plan and demonstrated an understanding of the instructions.   The patient was advised to call back or seek an in-person evaluation if the symptoms worsen or if the condition fails to improve as anticipated.   Darcel Smalling, MD     [facility] MD/PA/NP OP Progress Note 06/19/23  4:00 PM Gina Rogers  MRN:  [id number]  Chief Complaint: Medication management follow-up for mood, anxiety, OCD and sleeping difficulties.  HPI: This is a 23 year old female in treatment for anxiety, OCD, sleeping difficulties, mood and also has history of trauma and multiple previous psychiatric hospitalizations in her early adolescent years.   She was seen and evaluated over telemedicine encounter for medication management follow-up.  She reported that in the interim since last appointment, they were evicted from their home because her mother was not able to make rental payment.  She now lives at her neighbor Ms Karin Lieu old home.  She has also left the job at [employer] because she was not getting paid appropriately for the work that she was doing.  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  She reported that she enjoys staying by herself away from her mom, and that has been positive despite all the other stressors.  She reported that her grandfather also passed away last  month.  She reported that despite all the stressors, she has been able to regulate her mood and anxiety.  She denied low lows, depressed mood, denied SI or HI and has been sleeping well.  She reported that she is struggling financially, has applied for food stamps, has not eaten anything since yesterday.  Discussed with her to restart her food bank, discussed that I can send a referral to a Child psychotherapist.  She reported that she has been able to manage and her food stamps has gotten approved.  She does not want to talk more about this.  She reported that she has been completely adherent to her medications and denied any side effects associated with them.  She reported that she continues to use 1 g of marijuana every day.  The patient is to continue her current medications, follow up again in a month or earlier if needed.  She verbalized understanding and agreed with this time.  Visit Diagnosis:    ICD-10-CM   1. Recurrent major depressive disorder, in partial remission (HCC)  F33.41     2. Other specified anxiety disorders  F41.8        Past Psychiatric History: As mentioned in initial H&P, reviewed today, no change   Past Medical History:  Past Medical History:  Diagnosis Date   Anxiety    Asthma    Bipolar 1 disorder (HCC)    Bipolar and related disorder (HCC) 03/08/2016   Concussion    Depression    No past surgical history on file.  Family  Psychiatric History: As mentioned in initial H&P, reviewed today, no change Family History:  Family History  Problem Relation Age of Onset   Anxiety disorder Maternal Uncle    Depression Maternal Uncle     Social History:  Social History   Socioeconomic History   Marital status: Single    Spouse name: Not on file   Number of children: 0   Years of education: Not on file   Highest education level: 11th grade  Occupational History    Comment: part time   Tobacco Use   Smoking status: Never   Smokeless tobacco: Never  Vaping Use    Vaping status: Some Days   Substances: Nicotine  Substance and Sexual Activity   Alcohol use: No   Drug use: Never   Sexual activity: Never  Other Topics Concern   Not on file  Social History Narrative   Not on file   Social Drivers of Health   Financial Resource Strain: Low Risk  (01/22/2018)   Overall Financial Resource Strain (CARDIA)    Difficulty of Paying Living Expenses: Not hard at all  Food Insecurity: No Food Insecurity (01/22/2018)   Hunger Vital Sign    Worried About Running Out of Food in the Last Year: Never true    Ran Out of Food in the Last Year: Never true  Transportation Needs: No Transportation Needs (01/22/2018)   PRAPARE - Administrator, Civil Service (Medical): No    Lack of Transportation (Non-Medical): No  Physical Activity: Sufficiently Active (01/22/2018)   Exercise Vital Sign    Days of Exercise per Week: 5 days    Minutes of Exercise per Session: 50 min  Stress: Stress Concern Present (01/22/2018)   [employer] of Occupational Health - Occupational Stress Questionnaire    Feeling of Stress : Very much  Social Connections: Unknown (01/22/2018)   Social Connection and Isolation Panel [NHANES]    Frequency of Communication with Friends and Family: Not on file    Frequency of Social Gatherings with Friends and Family: Not on file    Attends Religious Services: Never    Active Member of Clubs or Organizations: No    Attends Banker Meetings: Never    Marital Status: Not on file    Allergies:  Allergies  Allergen Reactions   Adhesive [Tape]    Tapentadol    Ativan [Lorazepam] Other (See Comments)    Mother does not want pt to receive - pt is not allergic.    Metabolic Disorder Labs: No results found for: "HGBA1C", "MPG" No results found for: "PROLACTIN" No results found for: "CHOL", "TRIG", "HDL", "CHOLHDL", "VLDL", "LDLCALC" No results found for: "TSH"  Therapeutic Level Labs: No results found for:  "LITHIUM" No results found for: "VALPROATE" No results found for: "CBMZ"  Current Medications: Current Outpatient Medications  Medication Sig Dispense Refill   docusate sodium (COLACE) 100 MG capsule Take 2 capsules (200 mg total) by mouth 2 (two) times daily. 120 capsule 0   FLUoxetine (PROZAC) 20 MG capsule Take 3 capsules (60 mg total) by mouth daily. 90 capsule 2   hydrOXYzine (ATARAX) 25 MG tablet TAKE 1 TABLET BY MOUTH EVERY MORNING AND AT NOON 60 tablet 1   hydrOXYzine (ATARAX) 50 MG tablet TAKE 1 TABLET(50 MG) BY MOUTH AT BEDTIME 30 tablet 1   mirtazapine (REMERON) 15 MG tablet Take 1 tablet (15 mg total) by mouth at bedtime. 30 tablet 2   PROAIR HFA 108 (90 Base) MCG/ACT  inhaler   1   propranolol (INDERAL) 10 MG tablet TAKE 1 TABLET(10 MG) BY MOUTH TWICE DAILY 60 tablet 2   RETIN-A 0.1 % cream APP A PEA SIZED AMT TO DRY FACE NIGHTLY  3   witch hazel-glycerin (TUCKS) pad Apply 1 application topically as needed for itching. 40 each 12   No current facility-administered medications for this visit.     Musculoskeletal:  Gait & Station: unable to assess since visit was over the telemedicine. Patient leans: N/A  Psychiatric Specialty Exam: ROSReview of 12 systems negative except as mentioned in HPI  unknown if currently breastfeeding.There is no height or weight on file to calculate BMI.  Mental Status Exam: Appearance: casually dressed; well groomed; no overt signs of trauma or distress noted Attitude: calm, cooperative with good eye contact Activity: No PMA/PMR, no tics/no tremors; no EPS noted  Speech: normal rate, rhythm and volume Thought Process: Logical, linear, and goal-directed.  Associations: no looseness, tangentiality, circumstantiality, flight of ideas, thought blocking or word salad noted Thought Content: (abnormal/psychotic thoughts): no abnormal or delusional thought process evidenced SI/HI: denies Si/Hi Perception: no illusions or visual/auditory  hallucinations noted; no response to internal stimuli demonstrated Mood & Affect: "fine"/restricted Judgment & Insight: both fair Attention and Concentration : Good Cognition : WNL Language : Good ADL - Intact   Screenings: AIMS    Flowsheet Row [facility]  AIMS Total Score 0      AUDIT    Flowsheet Row [facility]  Alcohol Use Disorder Identification Test Final Score (AUDIT) 0       Synopsis: - 23 year-old biracial female, 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, 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 Mother had previously expressed concerns for bipolar disorder, but it seemed less likely in the absence of typical manic/hypomanic episodes which are consistent with  bipolar disorder.  - Pt also reports hx most likely consistent with OCD  Update on 06/19/23  - she returns to clinic after 2 months, has multiple psychosocial stressors at present however continues to report that her mood and anxiety are stable and she has been compliant with her medications.  Therefore recommending to continue with them and follow up in about 1 months or earlier if needed.    Plan: Plan reviewed on 06/19/23   # Mood, Anxiety (chronic, stable), OCD (chronic and  stable) - Continue Prozac 60 mg daily.  - Continue with Remeron 15 mg QHS - Continue Atarax 25 mg QAM PRN for anxiety, 50 mg QHS, and continue atarax 25 mg PRN at noon,  - Continue Propranolol 10 mg BID.   Insomnia: - Continue Remeron 15 mg QHS - can take Atarax 25-50 mg QHS PRN    This note was generated in part or whole with voice recognition software. Voice recognition is usually quite accurate but there are transcription errors that can and very often do occur. I apologize for any typographical errors that  were not detected and corrected.        Pilar Bridge, MD 06/19/23  5:41 PM

## 2023-06-28 ENCOUNTER — Other Ambulatory Visit: Payer: Self-pay

## 2023-06-28 ENCOUNTER — Emergency Department
Admission: EM | Admit: 2023-06-28 | Discharge: 2023-06-30 | Disposition: A | Discharge status: Home / Self Care | Attending: Emergency Medicine | Admitting: Emergency Medicine

## 2023-06-28 DIAGNOSIS — F32A Depression, unspecified: Secondary | ICD-10-CM | POA: Diagnosis present

## 2023-06-28 DIAGNOSIS — F422 Mixed obsessional thoughts and acts: Secondary | ICD-10-CM | POA: Diagnosis present

## 2023-06-28 DIAGNOSIS — G4709 Other insomnia: Secondary | ICD-10-CM | POA: Diagnosis present

## 2023-06-28 DIAGNOSIS — R45851 Suicidal ideations: Secondary | ICD-10-CM | POA: Diagnosis not present

## 2023-06-28 DIAGNOSIS — F331 Major depressive disorder, recurrent, moderate: Secondary | ICD-10-CM | POA: Diagnosis not present

## 2023-06-28 DIAGNOSIS — F329 Major depressive disorder, single episode, unspecified: Secondary | ICD-10-CM | POA: Diagnosis present

## 2023-06-28 LAB — URINE DRUG SCREEN, QUALITATIVE (ARMC ONLY)
Amphetamines, Ur Screen: NOT DETECTED
Barbiturates, Ur Screen: NOT DETECTED
Benzodiazepine, Ur Scrn: NOT DETECTED
Cannabinoid 50 Ng, Ur ~~LOC~~: POSITIVE — AB
Cocaine Metabolite,Ur ~~LOC~~: NOT DETECTED
MDMA (Ecstasy)Ur Screen: NOT DETECTED
Methadone Scn, Ur: NOT DETECTED
Opiate, Ur Screen: NOT DETECTED
Phencyclidine (PCP) Ur S: NOT DETECTED
Tricyclic, Ur Screen: NOT DETECTED

## 2023-06-28 LAB — CBC
HCT: 40.3 % (ref 36.0–46.0)
Hemoglobin: 14.1 g/dL (ref 12.0–15.0)
MCH: 33.6 pg (ref 26.0–34.0)
MCHC: 35 g/dL (ref 30.0–36.0)
MCV: 96 fL (ref 80.0–100.0)
Platelets: 245 10*3/uL (ref 150–400)
RBC: 4.2 MIL/uL (ref 3.87–5.11)
RDW: 11.2 % — ABNORMAL LOW (ref 11.5–15.5)
WBC: 6.7 10*3/uL (ref 4.0–10.5)
nRBC: 0 % (ref 0.0–0.2)

## 2023-06-28 LAB — COMPREHENSIVE METABOLIC PANEL WITH GFR
ALT: 13 U/L (ref 0–44)
AST: 22 U/L (ref 15–41)
Albumin: 4.5 g/dL (ref 3.5–5.0)
Alkaline Phosphatase: 45 U/L (ref 38–126)
Anion gap: 13 (ref 5–15)
BUN: 10 mg/dL (ref 6–20)
CO2: 22 mmol/L (ref 22–32)
Calcium: 9.7 mg/dL (ref 8.9–10.3)
Chloride: 99 mmol/L (ref 98–111)
Creatinine, Ser: 0.71 mg/dL (ref 0.44–1.00)
GFR, Estimated: >60 mL/min (ref >60)
Glucose, Bld: 99 mg/dL (ref 70–99)
Potassium: 3.7 mmol/L (ref 3.5–5.1)
Sodium: 134 mmol/L — ABNORMAL LOW (ref 135–145)
Total Bilirubin: 0.9 mg/dL (ref 0.0–1.2)
Total Protein: 8.1 g/dL (ref 6.5–8.1)

## 2023-06-28 LAB — ETHANOL: Alcohol, Ethyl (B): <15 mg/dL (ref <15)

## 2023-06-28 LAB — SALICYLATE LEVEL: Salicylate Lvl: <7 mg/dL — ABNORMAL LOW (ref 7.0–30.0)

## 2023-06-28 LAB — ACETAMINOPHEN LEVEL: Acetaminophen (Tylenol), Serum: <10 ug/mL — ABNORMAL LOW (ref 10–30)

## 2023-06-28 LAB — POC URINE PREG, ED: Preg Test, Ur: NEGATIVE

## 2023-06-28 MED ORDER — PROPRANOLOL HCL 10 MG PO TABS
10.0000 mg | ORAL_TABLET | Freq: Two times a day (BID) | ORAL | Status: DC
  Administered 2023-06-28 – 2023-06-30 (×4): 10 mg via ORAL
  Filled 2023-06-28 (×4): qty 1

## 2023-06-28 MED ORDER — HYDROXYZINE HCL 25 MG PO TABS
50.0000 mg | ORAL_TABLET | Freq: Every day | ORAL | Status: DC
  Administered 2023-06-28 – 2023-06-29 (×2): 50 mg via ORAL
  Filled 2023-06-28 (×2): qty 2

## 2023-06-28 MED ORDER — HYDROXYZINE HCL 25 MG PO TABS: 25.0000 mg | ORAL_TABLET | Freq: Every day | ORAL | Status: DC | PRN

## 2023-06-28 MED ORDER — MIRTAZAPINE 15 MG PO TABS
15.0000 mg | ORAL_TABLET | Freq: Every day | ORAL | Status: DC
  Administered 2023-06-28 – 2023-06-29 (×2): 15 mg via ORAL
  Filled 2023-06-28 (×2): qty 1

## 2023-06-28 MED ORDER — FLUOXETINE HCL 20 MG PO CAPS
60.0000 mg | ORAL_CAPSULE | Freq: Every day | ORAL | Status: DC
  Administered 2023-06-29 – 2023-06-30 (×2): 60 mg via ORAL
  Filled 2023-06-28 (×2): qty 3

## 2023-06-28 NOTE — Consult Note (Signed)
 [facility] Psychiatric Consult Initial  Patient Name: .Gina Rogers  MRN: [id number]  DOB: [birth year]  Consult Order details:  Orders (From admission, onward)     Start     Ordered   06/28/23 1615  CONSULT TO CALL ACT TEAM       Ordering Provider: Lubertha Rush, MD  Provider:  (Not yet assigned)  Question:  Reason for Consult?  Answer:  Psych consult   06/28/23 1615   06/28/23 1615  IP CONSULT TO PSYCHIATRY       Ordering Provider: Lubertha Rush, MD  Provider:  (Not yet assigned)  Question Answer Comment  Place call to: [phone]   Reason for Consult Admit      06/28/23 1615   06/28/23 1505  CONSULT TO CALL ACT TEAM       Ordering Provider: Lubertha Rush, MD  Provider:  (Not yet assigned)  Question:  Reason for Consult?  Answer:  [facility]   06/28/23 1504             Mode of Visit: Tele-visit Virtual Statement:TELE PSYCHIATRY ATTESTATION & CONSENT As the provider for this telehealth consult, I attest that I verified the patient's identity using two separate identifiers, introduced myself to the patient, provided my credentials, disclosed my location, and performed this encounter via a HIPAA-compliant, real-time, face-to-face, two-way, interactive audio and video platform and with the full consent and agreement of the patient (or guardian as applicable.) Patient physical location: [facility] ER. Telehealth provider physical location: home office in state of [place].   Video start time:   Video end time:      Psychiatry Consult Evaluation  Service Date: June 29, 2023 LOS:  LOS: 0 days  Chief Complaint Patient presents following an involuntary commitment initiated by her mother due to concerns of suicidal ideation.  Primary Psychiatric Diagnoses     Major depressive disorder   Other insomnia   Mixed obsessional thoughts and acts   Suicidal ideation Assessment  The patient presents following an involuntary commitment initiated by her mother, who reported concerns of suicidal ideation. The  patient denies current suicidal thoughts, plan, or intent. She acknowledges significant psychosocial stressors, including lack of food intake for two days and loss of electricity at her residence. Although she has a history of prior suicide attempt by overdose, she currently expresses protective factors, including strong emotional attachment to her cat. During the evaluation, she appears oriented and able to articulate her denial of current suicidality. No active psychosis or mania is observed. However, situational stressors, strained family dynamics, and previous overdose history place her at elevated risk, warranting close monitoring.  Diagnoses:  Active Hospital problems: Principal Problem:   Major depressive disorder Active Problems:   Other insomnia   Mixed obsessional thoughts and acts   Suicidal ideation    Plan  Continue current psychiatric hold for further assessment and stabilization as indicated.  Initiate supportive psychotherapy to explore coping strategies and address psychosocial stressors.  Recommend comprehensive suicide risk assessment daily.  Evaluate for possible major depressive disorder and assess for PTSD or adjustment disorder features if relevant.  Initiate social work consult to assess for housing instability, basic needs (food, utilities), and potential community resources.  Encourage family therapy referral post-crisis if appropriate to address family dynamics.  Reassess for potential discharge planning based on psychiatric stability, safety planning, and access to basic needs.    ## Psychiatric Medication Recommendations:   Current Facility-Administered Medications:    FLUoxetine  (PROZAC ) capsule 60 mg,  60 mg, Oral, Daily, Lubertha Rush, MD   hydrOXYzine  (ATARAX ) tablet 25 mg, 25 mg, Oral, Daily PRN, Lubertha Rush, MD   hydrOXYzine  (ATARAX ) tablet 50 mg, 50 mg, Oral, QHS, Lubertha Rush, MD, 50 mg at 06/28/23 2112   mirtazapine  (REMERON ) tablet 15 mg, 15  mg, Oral, QHS, Lubertha Rush, MD, 15 mg at 06/28/23 2112   propranolol  (INDERAL ) tablet 10 mg, 10 mg, Oral, BID, Funke, Mary E, MD, 10 mg at 06/28/23 2112  Current Outpatient Medications:    FLUoxetine  (PROZAC ) 20 MG capsule, Take 3 capsules (60 mg total) by mouth daily., Disp: 90 capsule, Rfl: 2   hydrOXYzine  (ATARAX ) 25 MG tablet, TAKE 1 TABLET BY MOUTH EVERY MORNING AND AT NOON (Patient taking differently: Take 25 mg by mouth daily as needed for anxiety.), Disp: 60 tablet, Rfl: 1   hydrOXYzine  (ATARAX ) 50 MG tablet, TAKE 1 TABLET(50 MG) BY MOUTH AT BEDTIME, Disp: 30 tablet, Rfl: 1   mirtazapine  (REMERON ) 15 MG tablet, Take 1 tablet (15 mg total) by mouth at bedtime., Disp: 30 tablet, Rfl: 2   propranolol  (INDERAL ) 10 MG tablet, TAKE 1 TABLET(10 MG) BY MOUTH TWICE DAILY, Disp: 60 tablet, Rfl: 2    ## Medical Decision Making Capacity: Not specifically addressed in this encounter  ## Further Work-up:  Lab Orders         Comprehensive metabolic panel         Ethanol         Salicylate level         Acetaminophen  level         cbc         Urine Drug Screen, Qualitative         POC urine preg, ED    Medication Management: Medications started  FLUoxetine   60 mg Oral Daily   hydrOXYzine   50 mg Oral QHS   mirtazapine   15 mg Oral QHS   propranolol   10 mg Oral BID      ## Disposition:-- reassess in the am pending collateral  ## Behavioral / Environmental: - No specific recommendations at this time.     ## Safety and Observation Level:  - Based on my clinical evaluation, I estimate the patient to be at moderate risk of self harm in the current setting. - At this time, we recommend  routine. This decision is based on my review of the chart including patient's history and current presentation, interview of the patient, mental status examination, and consideration of suicide risk including evaluating suicidal ideation, plan, intent, suicidal or self-harm behaviors, risk factors, and  protective factors. This judgment is based on our ability to directly address suicide risk, implement suicide prevention strategies, and develop a safety plan while the patient is in the clinical setting. Please contact our team if there is a concern that risk level has changed.  CSSR Risk Category:C-SSRS RISK CATEGORY: No Risk   Thank you for this consult request. Recommendations have been communicated to the primary team.  We will reassess in the am at this time.   Al Alias, NP       History of Present Illness  The patient reports that her mother initiated an involuntary commitment (IVC) after informing authorities that the patient expressed a desire to harm herself. The patient states that she told her mother she would "rather die than live with her," but denies making any direct statements of intent to harm herself. She reports significant psychosocial stressors, including not  having eaten in two days and experiencing a recent electricity shutoff at her residence. The patient has a history of a prior suicide attempt by overdose but denies current suicidal ideation. She expresses a strong emotional attachment to her cat and reports that even if her cat were to die, she would not act on any self-harm thoughts. Overall, the patient denies the allegations made by her mother regarding current suicidal intent.  Review of Systems  Psychiatric/Behavioral:  Negative for hallucinations. The patient is nervous/anxious.   All other systems reviewed and are negative.    Psychiatric and Social History  Psychiatric History:  Information collected from person  Access to weapons/lethal means: denies   Substance History Social History   Tobacco Use   Smoking status: Never   Smokeless tobacco: Never  Vaping Use   Vaping status: Some Days   Substances: Nicotine  Substance Use Topics   Alcohol use: No   Drug use: Never       Exam Findings  Physical Exam:  Vital Signs:  Temp:  [98.4 F  (36.9 C)-98.8 F (37.1 C)] 98.8 F (37.1 C) (04/26 2000) Pulse Rate:  [64-85] 64 (04/26 2000) Resp:  [16] 16 (04/26 2000) BP: (128-133)/(75-95) 128/75 (04/26 2000) SpO2:  [95 %-98 %] 98 % (04/26 2000) Blood pressure 128/75, pulse 64, temperature 98.8 F (37.1 C), temperature source Oral, resp. rate 16, height 5\' 6"  (1.676 m), SpO2 98%, unknown if currently breastfeeding. There is no height or weight on file to calculate BMI.  Physical Exam Vitals and nursing note reviewed.  Constitutional:      Appearance: Normal appearance.  HENT:     Head: Normocephalic and atraumatic.     Mouth/Throat:     Mouth: Mucous membranes are dry.  Eyes:     Pupils: Pupils are equal, round, and reactive to light.  Pulmonary:     Effort: Pulmonary effort is normal.  Musculoskeletal:        General: Normal range of motion.     Cervical back: Normal range of motion.  Skin:    General: Skin is warm and dry.  Neurological:     Mental Status: She is alert and oriented to person, place, and time.     Mental Status Exam: General Appearance: Casual  Orientation:  Full (Time, Place, and Person)  Memory:  Immediate;   Good  Concentration:  Concentration: Good  Recall:  Good  Attention  Good  Eye Contact:  Good  Speech:  Clear and Coherent  Language:  Good  Volume:  Normal  Mood: labile  Affect:  NA  Thought Process:  Coherent  Thought Content:  WDL  Suicidal Thoughts:  No  Homicidal Thoughts:  No  Judgement:  Fair  Insight:  Fair  Psychomotor Activity:  Normal  Akathisia:  No  Fund of Knowledge:  Good      Assets:  Communication Skills Resilience  Cognition:  WNL  ADL's:  Intact  AIMS (if indicated):        Other History   These have been pulled in through the EMR, reviewed, and updated if appropriate.  Family History:  The patient's family history includes Anxiety disorder in her maternal uncle; Depression in her maternal uncle.  Medical History: Past Medical History:  Diagnosis  Date   Anxiety    Asthma    Bipolar 1 disorder (HCC)    Bipolar and related disorder (HCC) 03/08/2016   Concussion    Depression     Surgical History: History  reviewed. No pertinent surgical history.   Medications:   Current Facility-Administered Medications:    FLUoxetine  (PROZAC ) capsule 60 mg, 60 mg, Oral, Daily, Lubertha Rush, MD   hydrOXYzine  (ATARAX ) tablet 25 mg, 25 mg, Oral, Daily PRN, Lubertha Rush, MD   hydrOXYzine  (ATARAX ) tablet 50 mg, 50 mg, Oral, QHS, Lubertha Rush, MD, 50 mg at 06/28/23 2112   mirtazapine  (REMERON ) tablet 15 mg, 15 mg, Oral, QHS, Lubertha Rush, MD, 15 mg at 06/28/23 2112   propranolol  (INDERAL ) tablet 10 mg, 10 mg, Oral, BID, Funke, Mary E, MD, 10 mg at 06/28/23 2112  Current Outpatient Medications:    FLUoxetine  (PROZAC ) 20 MG capsule, Take 3 capsules (60 mg total) by mouth daily., Disp: 90 capsule, Rfl: 2   hydrOXYzine  (ATARAX ) 25 MG tablet, TAKE 1 TABLET BY MOUTH EVERY MORNING AND AT NOON (Patient taking differently: Take 25 mg by mouth daily as needed for anxiety.), Disp: 60 tablet, Rfl: 1   hydrOXYzine  (ATARAX ) 50 MG tablet, TAKE 1 TABLET(50 MG) BY MOUTH AT BEDTIME, Disp: 30 tablet, Rfl: 1   mirtazapine  (REMERON ) 15 MG tablet, Take 1 tablet (15 mg total) by mouth at bedtime., Disp: 30 tablet, Rfl: 2   propranolol  (INDERAL ) 10 MG tablet, TAKE 1 TABLET(10 MG) BY MOUTH TWICE DAILY, Disp: 60 tablet, Rfl: 2  Allergies: Allergies  Allergen Reactions   Adhesive [Tape]    Tapentadol    Ativan  [Lorazepam ] Other (See Comments)    Mother does not want pt to receive - pt is not allergic.    Al Alias, NP

## 2023-06-28 NOTE — ED Provider Notes (Signed)
 [facility] Provider Note    Event Date/Time   First MD Initiated Contact with Patient 06/28/23 1609     (approximate)   History   Psychiatric Evaluation   HPI  Gina Rogers is a 23 y.o. female with history of depression who comes in under IVC placed by mother.  Patient has been texting her mother about dying and by her mother to her funeral.  Patient did threaten to take one of the officers guns and shoot herself per triage note.  Patient denies any SI, HI, auditory visual halluncations to me.  She denies any medical concerns. Denies any si attempt today  Physical Exam   Triage Vital Signs: ED Triage Vitals  Encounter Vitals Group     BP 06/28/23 1502 (!) 133/95     Systolic BP Percentile --      Diastolic BP Percentile --      Pulse Rate 06/28/23 1502 85     Resp 06/28/23 1502 16     Temp 06/28/23 1502 98.4 F (36.9 C)     Temp Source 06/28/23 1502 Oral     SpO2 06/28/23 1502 95 %     Weight --      Height 06/28/23 1503 5\' 6"  (1.676 m)     Head Circumference --      Peak Flow --      Pain Score 06/28/23 1502 0     Pain Loc --      Pain Education --      Exclude from Growth Chart --     Most recent vital signs: Vitals:   06/28/23 1502  BP: (!) 133/95  Pulse: 85  Resp: 16  Temp: 98.4 F (36.9 C)  SpO2: 95%     General: Awake, no distress.  CV:  Good peripheral perfusion.  Resp:  Normal effort.  Abd:  No distention.  Other:     ED Results / Procedures / Treatments   Labs (all labs ordered are listed, but only abnormal results are displayed) Labs Reviewed  COMPREHENSIVE METABOLIC PANEL WITH GFR - Abnormal; Notable for the following components:      Result Value   Sodium 134 (*)    All other components within normal limits  SALICYLATE LEVEL - Abnormal; Notable for the following components:   Salicylate Lvl <7.0 (*)    All other components within normal limits  ACETAMINOPHEN  LEVEL - Abnormal; Notable for the following  components:   Acetaminophen  (Tylenol ), Serum <10 (*)    All other components within normal limits  CBC - Abnormal; Notable for the following components:   RDW 11.2 (*)    All other components within normal limits  URINE DRUG SCREEN, QUALITATIVE ([facility] ONLY) - Abnormal; Notable for the following components:   Cannabinoid 50 Ng, Ur  POSITIVE (*)    All other components within normal limits  ETHANOL  POC URINE PREG, ED     PROCEDURES:  Critical Care performed: No  Procedures   MEDICATIONS ORDERED IN ED: Medications - No data to display   IMPRESSION / MDM / ASSESSMENT AND PLAN / ED COURSE  I reviewed the triage vital signs and the nursing notes.   Patient's presentation is most consistent with acute presentation with potential threat to life or bodily function.    Pt is without any acute medical complaints. No exam findings to suggest medical cause of current presentation. Will order psychiatric screening labs and discuss further w/ psychiatric service.  D/d includes but  is not limited to psychiatric disease, behavioral/personality disorder, inadequate socioeconomic support, medical.  Based on HPI, exam, unremarkable labs, no concern for acute medical problem at this time. No rigidity, clonus, hyperthermia, focal neurologic deficit, diaphoresis, tachycardia, meningismus, ataxia, gait abnormality or other finding to suggest this visit represents a non-psychiatric problem. Screening labs reviewed.    Given this, pt medically cleared, to be dispositioned per Psych.    The patient has been placed in psychiatric observation due to the need to provide a safe environment for the patient while obtaining psychiatric consultation and evaluation, as well as ongoing medical and medication management to treat the patient's condition.  The patient has been placed under full IVC at this time.   Pregnancy test negative CMP reassuring alcohol negative salicylate negative Tylenol  negative CBC  reassuring  The patient is on the cardiac monitor to evaluate for evidence of arrhythmia and/or significant heart rate changes.      FINAL CLINICAL IMPRESSION(S) / ED DIAGNOSES   Final diagnoses:  Depression, unspecified depression type     Rx / DC Orders   ED Discharge Orders     None        Note:  This document was prepared using Dragon voice recognition software and may include unintentional dictation errors.   Lubertha Rush, MD 06/28/23 [phone]

## 2023-06-28 NOTE — ED Notes (Signed)
IVC, pend Psych consult 

## 2023-06-28 NOTE — Consult Note (Incomplete)
 [facility] Psychiatric Consult Initial  Patient Name: .Gina Rogers  MRN: [id number]  DOB: August 02, 2000  Consult Order details:  Orders (From admission, onward)     Start     Ordered   06/28/23 1615  CONSULT TO CALL ACT TEAM       Ordering Provider: Lubertha Rush, MD  Provider:  (Not yet assigned)  Question:  Reason for Consult?  Answer:  Psych consult   06/28/23 1615   06/28/23 1615  IP CONSULT TO PSYCHIATRY       Ordering Provider: Lubertha Rush, MD  Provider:  (Not yet assigned)  Question Answer Comment  Place call to: [phone]   Reason for Consult Admit      06/28/23 1615   06/28/23 1505  CONSULT TO CALL ACT TEAM       Ordering Provider: Lubertha Rush, MD  Provider:  (Not yet assigned)  Question:  Reason for Consult?  Answer:  [facility]   06/28/23 1504             Mode of Visit: Tele-visit Virtual Statement:TELE PSYCHIATRY ATTESTATION & CONSENT As the provider for this telehealth consult, I attest that I verified the patient's identity using two separate identifiers, introduced myself to the patient, provided my credentials, disclosed my location, and performed this encounter via a HIPAA-compliant, real-time, face-to-face, two-way, interactive audio and video platform and with the full consent and agreement of the patient (or guardian as applicable.) Patient physical location: [facility]. Telehealth provider physical location: home office in state of [place].   Video start time:   Video end time:      Psychiatry Consult Evaluation  Service Date: June 28, 2023 LOS:  LOS: 0 days  Chief Complaint ***  Primary Psychiatric Diagnoses  *** 2.  *** 3.  ***  Assessment  Lennyn Middaugh is a 23 y.o. female admitted: {CHL BH Medical or Presented to JY:78295}AOZ 06/28/2023  4:23 PM for ***. She carries the psychiatric diagnoses of *** and has a past medical history of  ***.   Her current presentation of *** is most consistent with ***. She meets criteria for *** based on ***.  Current outpatient  psychotropic medications include *** and historically she has had a *** response to these medications. She was *** compliant with medications prior to admission as evidenced by ***. On initial examination, patient ***. Please see plan below for detailed recommendations.   Diagnoses:  Active Hospital problems: Active Problems:   * No active hospital problems. *    Plan   ## Psychiatric Medication Recommendations:    ## Medical Decision Making Capacity: {CHL BH MEDICAL DECISION MAKING CAPACITY:31818}  ## Further Work-up:  -- *** {CHLmacgeneralandspecificworkuprecs:31821} -- most recent EKG on *** had QtC of *** -- Pertinent labwork reviewed earlier this admission includes: ***   ## Disposition:-- {CHLmaccldispo:31820}  ## Behavioral / Environmental: -{CHLmacbehavioralenvironmental2:31847}    ## Safety and Observation Level:  - Based on my clinical evaluation, I estimate the patient to be at *** risk of self harm in the current setting. - At this time, we recommend  {CHL BH SUICIDE OBSERVATION LEVEL:31850}. This decision is based on my review of the chart including patient's history and current presentation, interview of the patient, mental status examination, and consideration of suicide risk including evaluating suicidal ideation, plan, intent, suicidal or self-harm behaviors, risk factors, and protective factors. This judgment is based on our ability to directly address suicide risk, implement suicide prevention strategies, and develop a safety plan  while the patient is in the clinical setting. Please contact our team if there is a concern that risk level has changed.  CSSR Risk Category:C-SSRS RISK CATEGORY: No Risk  Suicide Risk Assessment: Patient has following modifiable risk factors for suicide: {CHLmacmodifiablesuicideriskfactors:31822}, which we are addressing by ***. Patient has following non-modifiable or demographic risk factors for suicide:  {CHLmacnonmodifiablesuicideriskfactors:31823} Patient has the following protective factors against suicide: {CHLmacprotectivefactors:31824}  Thank you for this consult request. Recommendations have been communicated to the primary team.  We will *** at this time.   Al Alias, NP       History of Present Illness  Relevant Aspects of Hospital [facility] or ED course:31819} Course:  Admitted on 06/28/2023 for ***. They ***.   Patient Report:  ***  Psych ROS:  Depression: *** Anxiety:  *** Mania (lifetime and current): *** Psychosis: (lifetime and current): ***  Collateral information:  Contacted *** at *** on ***  Review of Systems  Psychiatric/Behavioral:  Negative for hallucinations. The patient is nervous/anxious.   All other systems reviewed and are negative.    Psychiatric and Social History  Psychiatric History:  Information collected from ***  Prev Dx/Sx: *** Current Psych Provider: *** Home Meds (current): *** Previous Med Trials: *** Therapy: ***  Prior Psych Hospitalization: ***  Prior Self Harm: *** Prior Violence: ***  Family Psych History: *** Family Hx suicide: ***  Social History:  Developmental Hx: *** Educational Hx: *** Occupational Hx: *** Legal Hx: *** Living Situation: *** Spiritual Hx: *** Access to weapons/lethal means: ***   Substance History Alcohol: ***  Type of alcohol *** Last Drink *** Number of drinks per day *** History of alcohol withdrawal seizures *** History of DT's *** Tobacco: *** Illicit drugs: *** Prescription drug abuse: *** Rehab hx: ***  Exam Findings  Physical Exam: *** Vital Signs:  Temp:  [98.4 F (36.9 C)-98.8 F (37.1 C)] 98.8 F (37.1 C) (04/26 2000) Pulse Rate:  [64-85] 64 (04/26 2000) Resp:  [16] 16 (04/26 2000) BP: (128-133)/(75-95) 128/75 (04/26 2000) SpO2:  [95 %-98 %] 98 % (04/26 2000) Blood pressure 128/75, pulse 64, temperature 98.8 F (37.1 C), temperature source Oral, resp.  rate 16, height 5\' 6"  (1.676 m), SpO2 98%, unknown if currently breastfeeding. There is no height or weight on file to calculate BMI.  Physical Exam Vitals and nursing note reviewed.  Constitutional:      Appearance: Normal appearance.  HENT:     Head: Normocephalic and atraumatic.     Mouth/Throat:     Mouth: Mucous membranes are dry.  Eyes:     Pupils: Pupils are equal, round, and reactive to light.  Pulmonary:     Effort: Pulmonary effort is normal.  Musculoskeletal:        General: Normal range of motion.     Cervical back: Normal range of motion.  Skin:    General: Skin is warm and dry.  Neurological:     Mental Status: She is alert and oriented to person, place, and time.     Mental Status Exam: General Appearance: {Appearance:22683}  Orientation:  {BHH ORIENTATION (PAA):22689}  Memory:  {BHH MEMORY:22881}  Concentration:  {Concentration:21399}  Recall:  {BHH GOOD/FAIR/POOR:22877}  Attention  {BH Attention Span:31825}  Eye Contact:  {BHH EYE CONTACT:22684}  Speech:  {Speech:22685}  Language:  {BHH GOOD/FAIR/POOR:22877}  Volume:  {Volume (PAA):22686}  Mood: ***  Affect:  {Affect (PAA):22687}  Thought Process:  {Thought Process (PAA):22688}  Thought Content:  {Thought Content:22690}  Suicidal Thoughts:  {ST/HT (PAA):22692}  Homicidal Thoughts:  {ST/HT (PAA):22692}  Judgement:  {Judgement (PAA):22694}  Insight:  {Insight (PAA):22695}  Psychomotor Activity:  {Psychomotor (PAA):22696}  Akathisia:  {BHH YES OR NO:22294}  Fund of Knowledge:  {BHH GOOD/FAIR/POOR:22877}      Assets:  {Assets (PAA):22698}  Cognition:  {chl bhh cognition:304700322}  ADL's:  {BHH UJW'J:19147}  AIMS (if indicated):        Other History   These have been pulled in through the EMR, reviewed, and updated if appropriate.  Family History:  The patient's family history includes Anxiety disorder in her maternal uncle; Depression in her maternal uncle.  Medical History: Past Medical  History:  Diagnosis Date  . Anxiety   . Asthma   . Bipolar 1 disorder (HCC)   . Bipolar and related disorder (HCC) 03/08/2016  . Concussion   . Depression     Surgical History: History reviewed. No pertinent surgical history.   Medications:   Current Facility-Administered Medications:  .  FLUoxetine  (PROZAC ) capsule 60 mg, 60 mg, Oral, Daily, Lubertha Rush, MD .  hydrOXYzine  (ATARAX ) tablet 25 mg, 25 mg, Oral, Daily PRN, Lubertha Rush, MD .  hydrOXYzine  (ATARAX ) tablet 50 mg, 50 mg, Oral, QHS, Lubertha Rush, MD .  mirtazapine  (REMERON ) tablet 15 mg, 15 mg, Oral, QHS, Lubertha Rush, MD .  propranolol  (INDERAL ) tablet 10 mg, 10 mg, Oral, BID, Lubertha Rush, MD  Current Outpatient Medications:  .  FLUoxetine  (PROZAC ) 20 MG capsule, Take 3 capsules (60 mg total) by mouth daily., Disp: 90 capsule, Rfl: 2 .  hydrOXYzine  (ATARAX ) 25 MG tablet, TAKE 1 TABLET BY MOUTH EVERY MORNING AND AT NOON (Patient taking differently: Take 25 mg by mouth daily as needed for anxiety.), Disp: 60 tablet, Rfl: 1 .  hydrOXYzine  (ATARAX ) 50 MG tablet, TAKE 1 TABLET(50 MG) BY MOUTH AT BEDTIME, Disp: 30 tablet, Rfl: 1 .  mirtazapine  (REMERON ) 15 MG tablet, Take 1 tablet (15 mg total) by mouth at bedtime., Disp: 30 tablet, Rfl: 2 .  propranolol  (INDERAL ) 10 MG tablet, TAKE 1 TABLET(10 MG) BY MOUTH TWICE DAILY, Disp: 60 tablet, Rfl: 2  Allergies: Allergies  Allergen Reactions  . Adhesive [Tape]   . Tapentadol   . Ativan  [Lorazepam ] Other (See Comments)    Mother does not want pt to receive - pt is not allergic.    Al Alias, NP

## 2023-06-28 NOTE — ED Notes (Signed)
 Dr Peggi Bowels evaluated pt in triage, pt is medically cleared to go to [facility].

## 2023-06-28 NOTE — BH Assessment (Signed)
 Assessment Note  Gina Rogers is an 23 y.o. female.   Diagnosis: Anxiety Disorder, Major Depressive Disorder  Past Medical History:  Past Medical History:  Diagnosis Date   Anxiety    Asthma    Bipolar 1 disorder (HCC)    Bipolar and related disorder (HCC) 03/08/2016   Concussion    Depression     History reviewed. No pertinent surgical history.  Family History:  Family History  Problem Relation Age of Onset   Anxiety disorder Maternal Uncle    Depression Maternal Uncle     Social History:  reports that she has never smoked. She has never used smokeless tobacco. She reports that she does not drink alcohol and does not use drugs.  Additional Social History:     CIWA: CIWA-Ar BP: 128/75 Pulse Rate: 64 COWS:    Allergies:  Allergies  Allergen Reactions   Adhesive [Tape]    Tapentadol    Ativan  [Lorazepam ] Other (See Comments)    Mother does not want pt to receive - pt is not allergic.    Home Medications: (Not in a hospital admission)   OB/GYN Status:  No LMP recorded (lmp unknown).  General Assessment Data Location of Assessment: [facility] ED Admission Status: Involuntary  Mental Status Report Motor Activity: Freedom of movement, Unremarkable    Abuse/Neglect Assessment (Assessment to be complete while patient is alone) Physical Abuse: Denies Verbal Abuse: Denies Sexual Abuse: Denies Exploitation of patient/patient's resources: Denies Self-Neglect: Denies     Merchant navy officer (For Healthcare) Does Patient Have a Medical Advance Directive?: No Would patient like information on creating a medical advance directive?: No - Patient declined   Disposition: Patient denies SI/HI.  Disposition pending collateral contact with mom.    On Site Evaluation by:   Reviewed with Physician:    Zollie Hipp 06/28/2023 10:56 PM

## 2023-06-28 NOTE — ED Notes (Addendum)
 Pt dressed out :  Black sandals Peach shorts Pink rubber bracelet Blue rubber bracelet Black tshirt Hairbow Black bra Black underwear One white metal necklace

## 2023-06-28 NOTE — ED Notes (Signed)
Speaking to TTS

## 2023-06-28 NOTE — ED Triage Notes (Addendum)
 Pt to ed from home via [employer] under IVC placed by her mother. Pt has been texting her mother about dying and inviting her mother to her funeral. Pt states "if my cat dies I am suing the city and I will kill myself". Pt threatened to take one of the officers guns and shoot herself. Pt has attempted suicide in the past by OD. Pt is alert, agitated and in handcuffs due to being uncooperative with PD on scene.   Pt is a vegetarian.

## 2023-06-29 DIAGNOSIS — R45851 Suicidal ideations: Secondary | ICD-10-CM | POA: Diagnosis not present

## 2023-06-29 NOTE — BH Assessment (Signed)
 Writer attempted to contact patient's mother, Alexis Idol [phone]; however message states number cannot be reached at this time and to try back later.

## 2023-06-29 NOTE — BH Assessment (Signed)
 Writer called patient's [employer] [phone]. Bridgette Campus said she doesn't know patient very well; however, she speaks very highly of her. Says patient is pleasant, stable, clean, has affirmations on the mirror, crystal chakras around the apartment for spiritual balance and just an overall nice young lady. She said she has not had a conversation with mom; only seen her in passing but she did not seem welcoming. Bridgette Campus mentioned that patient used to live with mom in another apartment but mom left the apartment due to non payment and also left the apartment nasty with cat feces/urine all over the floor. Bridgette Campus says with knowing that information, she does not believe patient's mom is stable.

## 2023-06-29 NOTE — BH Assessment (Signed)
 Writer left message for Gina Rogers(mom) [phone] (per IVC paperwork) to return call.

## 2023-06-29 NOTE — ED Notes (Signed)
 Given dinner and beverage

## 2023-06-29 NOTE — ED Provider Notes (Signed)
 Emergency Medicine Observation Re-evaluation Note  Gina Rogers is a 23 y.o. female, seen on rounds today.  Pt initially presented to the ED for complaints of Psychiatric Evaluation Currently, the patient is resting.  Physical Exam  BP 128/75 (BP Location: Left Arm)   Pulse 64   Temp 98.8 F (37.1 C) (Oral)   Resp 16   Ht 5\' 6"  (1.676 m)   LMP  (LMP Unknown)   SpO2 98%  Physical Exam .Gen:  No acute distress Resp:  Breathing easily and comfortably, no accessory muscle usage Neuro:  Moving all four extremities, no gross focal neuro deficits Psych:  Resting currently, calm when awake   ED Course / MDM  EKG:   I have reviewed the labs performed to date as well as medications administered while in observation.  Recent changes in the last 24 hours include no acute events.  Plan  Current plan is for pending psyc dispo.    Shane Darling, MD 06/29/23 [phone]

## 2023-06-29 NOTE — ED Notes (Signed)
 Pt on video call with psych

## 2023-06-29 NOTE — ED Notes (Signed)
 Pt lunch provided at bedside

## 2023-06-29 NOTE — ED Notes (Signed)
 Pt dinner tray did not arrive with others in the [facility], dietary called and is sending one down

## 2023-06-29 NOTE — ED Notes (Signed)
 Pt Breakfast provided at bedside
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
## 2023-06-29 NOTE — ED Notes (Signed)
 Pt given supplies to shower now Completed.
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
## 2023-06-30 DIAGNOSIS — F32A Depression, unspecified: Secondary | ICD-10-CM | POA: Diagnosis not present

## 2023-06-30 NOTE — ED Notes (Addendum)
 Hospital meal provided.  Pt states she is vegetarian. Diet order modified and pt provided cereal and grapes.

## 2023-06-30 NOTE — ED Notes (Signed)
 Pt lunch provided at bedside
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
## 2023-06-30 NOTE — ED Provider Notes (Signed)
 I received message from psychiatrist Dr Deborah Falling and NP cecil that pt was cleared for discharge by them and they requested I rescind the IVC.    Lubertha Rush, MD 06/30/23 [phone]

## 2023-06-30 NOTE — ED Notes (Signed)
 IVC/Pending Disposition

## 2023-06-30 NOTE — Discharge Instructions (Signed)
 Cleared by psychiatry for dc home Return for worsening symptoms or other concerns

## 2023-06-30 NOTE — BH Assessment (Signed)
 Psych Team met with patient for reassessment. Patient is extremely concerned about missing orientation for her new job which began today. Patient states she does not understand why she was brought to the ED. Patient reports although she has had some issues and an attempt several years ago when she was an adolescent, she does not think it should continue to follow her when she is taking her medications as prescribed and seeing her psychiatrist regularly. Patient states she has a toxic relationship with her mom in which she is actively seeking therapy for healing and possible resolution. Patient states she is trying to mover forward with her life in a positive way. She understands her mom is a trigger and from now on, she will limit their interactions. Patient says she support from her friends and neighbors that she is able to lean on in times of trouble.   Per Alaine Howells, NP, patient does not meet criteria for inpatient psychiatric admission and can be discharged home.

## 2023-06-30 NOTE — ED Notes (Signed)
 Patient at the door asking when she may get to go home, she is wanting to go home, nurse told her she would talk to the Doctor. Patient is being calm.

## 2023-06-30 NOTE — ED Notes (Signed)
 Writer reveiwed discharge paperwork with patient. Patient verballized understanding. Patient agreeable with discharge.  Belongings returned to patient and patient dressed into her clothing.

## 2023-06-30 NOTE — ED Notes (Signed)
 Pt breakfast provided at bedside

## 2023-06-30 NOTE — ED Notes (Signed)
 Ivc PENDING DISPOSITION

## 2023-07-24 ENCOUNTER — Telehealth: Admitting: Child and Adolescent Psychiatry

## 2023-07-24 DIAGNOSIS — F418 Other specified anxiety disorders: Secondary | ICD-10-CM | POA: Diagnosis not present

## 2023-07-24 DIAGNOSIS — F3341 Major depressive disorder, recurrent, in partial remission: Secondary | ICD-10-CM | POA: Diagnosis not present

## 2023-07-24 MED ORDER — PROPRANOLOL HCL 10 MG PO TABS
ORAL_TABLET | ORAL | 0 refills | Status: AC
Start: 2023-07-24 — End: ?

## 2023-07-24 MED ORDER — HYDROXYZINE HCL 25 MG PO TABS: ORAL_TABLET | ORAL | 0 refills | Status: AC

## 2023-07-24 MED ORDER — FLUOXETINE HCL 20 MG PO CAPS: 60.0000 mg | ORAL_CAPSULE | Freq: Every day | ORAL | 0 refills | Status: AC

## 2023-07-24 MED ORDER — HYDROXYZINE HCL 50 MG PO TABS: ORAL_TABLET | ORAL | 0 refills | Status: AC

## 2023-07-24 NOTE — Progress Notes (Signed)
 Virtual Visit via Video Note  I connected with Gina Rogers on 07/25/23 at  2:00 PM EDT by a video enabled telemedicine application and verified that I am speaking with the correct person using two identifiers.  Location: Patient: home Provider: office   I discussed the limitations of evaluation and management by telemedicine and the availability of in person appointments. The patient expressed understanding and agreed to proceed.    I discussed the assessment and treatment plan with the patient. The patient was provided an opportunity to ask questions and all were answered. The patient agreed with the plan and demonstrated an understanding of the instructions.   The patient was advised to call back or seek an in-person evaluation if the symptoms worsen or if the condition fails to improve as anticipated.   Pilar Bridge, MD     [facility] MD/PA/NP OP Progress Note 07/25/23  4:00 PM Gina Rogers  MRN:  [id number]  Chief Complaint: Medication management follow-up for mood, anxiety, OCD and sleeping difficulties.  HPI: This is a 23 year old female in treatment for anxiety, OCD, sleeping difficulties, mood and also has history of trauma and multiple previous psychiatric hospitalizations in her early adolescent years.   She was seen and evaluated over telemedicine encounter for medication management follow-up.  She reported that in the interim since last appointment, she started working at [employer] every other day if she has to be going well for her.  She is continuing to have difficulties with electricity at her house, however her neighbor is allowing her to cook at her place.  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  She reported that she interviewed with them, did the background search from chef and his wife.  She reported that she is very excited, she is  flying from [place] to [place]  where the sheriff of [employer] and his wife will come and pick her up.  She reported that her mother is aware about her plan, she has also informed Mr. Christia Cowboy her previous neighbor and she is currently living in her house.  She reported that overall she has been doing "good", has not been feeling anxious as she was before, her mood is "good", denied feeling depressed, denied anhedonia, reported that she has been sleeping okay in the context of lack of electricity at her home.  She denied any suicidal thoughts or homicidal thoughts since last appointment.  On reviewing her chart, it was noted that she had an emergency room visit for about 3 days in April, when asked about this, she reported that her mother got into an argument with her, she asked her to pay her $200 to be able to live with her.  Patient reported that she refused to give her the money and said something along the lines of she would rather die than giving her the money.  Patient reported that her mother got upset, sent someone for welfare check and then IVCd her.  Patient reported that she did not have any suicidal thoughts and was subsequently discharged after a 72-hour hold.  She reported that she had interacted with her mother since then and will be seeing her today before she leaves for [place] .  Visit Diagnosis:    ICD-10-CM   1. Recurrent major depressive disorder, in partial remission (HCC)  F33.41     2. Other specified  anxiety disorders  F41.8 FLUoxetine  (PROZAC ) 20 MG capsule    hydrOXYzine  (ATARAX ) 25 MG tablet    hydrOXYzine  (ATARAX ) 50 MG tablet    propranolol  (INDERAL ) 10 MG tablet        Past Psychiatric History: As mentioned in initial H&P, reviewed today, no change   Past Medical History:  Past Medical History:  Diagnosis Date   Anxiety    Asthma    Bipolar 1 disorder (HCC)    Bipolar and related disorder (HCC) 03/08/2016   Concussion    Depression    No past  surgical history on file.  Family Psychiatric History: As mentioned in initial H&P, reviewed today, no change Family History:  Family History  Problem Relation Age of Onset   Anxiety disorder Maternal Uncle    Depression Maternal Uncle     Social History:  Social History   Socioeconomic History   Marital status: Single    Spouse name: Not on file   Number of children: 0   Years of education: Not on file   Highest education level: 11th grade  Occupational History    Comment: part time   Tobacco Use   Smoking status: Never   Smokeless tobacco: Never  Vaping Use   Vaping status: Some Days   Substances: Nicotine  Substance and Sexual Activity   Alcohol use: No   Drug use: Never   Sexual activity: Never  Other Topics Concern   Not on file  Social History Narrative   Not on file   Social Drivers of Health   Financial Resource Strain: Low Risk  (01/22/2018)   Overall Financial Resource Strain (CARDIA)    Difficulty of Paying Living Expenses: Not hard at all  Food Insecurity: Food Insecurity Present (06/28/2023)   Hunger Vital Sign    Worried About Running Out of Food in the Last Year: Often true    Ran Out of Food in the Last Year: Often true  Transportation Needs: No Transportation Needs (01/22/2018)   PRAPARE - Administrator, Civil Service (Medical): No    Lack of Transportation (Non-Medical): No  Physical Activity: Sufficiently Active (01/22/2018)   Exercise Vital Sign    Days of Exercise per Week: 5 days    Minutes of Exercise per Session: 50 min  Stress: Stress Concern Present (01/22/2018)   [employer] of Occupational Health - Occupational Stress Questionnaire    Feeling of Stress : Very much  Social Connections: Unknown (01/22/2018)   Social Connection and Isolation Panel [NHANES]    Frequency of Communication with Friends and Family: Not on file    Frequency of Social Gatherings with Friends and Family: Not on file    Attends Religious  Services: Never    Active Member of Clubs or Organizations: No    Attends Banker Meetings: Never    Marital Status: Not on file    Allergies:  Allergies  Allergen Reactions   Adhesive [Tape]    Tapentadol    Ativan  [Lorazepam ] Other (See Comments)    Mother does not want pt to receive - pt is not allergic.   Pineapple Itching    Metabolic Disorder Labs: No results found for: "HGBA1C", "MPG" No results found for: "PROLACTIN" No results found for: "CHOL", "TRIG", "HDL", "CHOLHDL", "VLDL", "LDLCALC" No results found for: "TSH"  Therapeutic Level Labs: No results found for: "LITHIUM" No results found for: "VALPROATE" No results found for: "CBMZ"  Current Medications: Current Outpatient Medications  Medication Sig Dispense  Refill   FLUoxetine  (PROZAC ) 20 MG capsule Take 3 capsules (60 mg total) by mouth daily. 270 capsule 0   hydrOXYzine  (ATARAX ) 25 MG tablet TAKE 1 TABLET BY MOUTH EVERY MORNING AND AT NOON 180 tablet 0   hydrOXYzine  (ATARAX ) 50 MG tablet TAKE 1 TABLET(50 MG) BY MOUTH AT BEDTIME 90 tablet 0   propranolol  (INDERAL ) 10 MG tablet TAKE 1 TABLET(10 MG) BY MOUTH TWICE DAILY 180 tablet 0   No current facility-administered medications for this visit.     Musculoskeletal:  Gait & Station: unable to assess since visit was over the telemedicine. Patient leans: N/A  Psychiatric Specialty Exam: ROSReview of 12 systems negative except as mentioned in HPI  unknown if currently breastfeeding.There is no height or weight on file to calculate BMI.  Mental Status Exam: Appearance: casually dressed; well groomed; no overt signs of trauma or distress noted Attitude: calm, cooperative with good eye contact Activity: No PMA/PMR, no tics/no tremors; no EPS noted  Speech: normal rate, rhythm and volume Thought Process: Logical, linear, and goal-directed.  Associations: no looseness, tangentiality, circumstantiality, flight of ideas, thought blocking or word salad  noted Thought Content: (abnormal/psychotic thoughts): no abnormal or delusional thought process evidenced SI/HI: denies Si/Hi Perception: no illusions or visual/auditory hallucinations noted; no response to internal stimuli demonstrated Mood & Affect: "good"/full range, neutral Judgment & Insight: both fair Attention and Concentration : Good Cognition : WNL Language : Good ADL - Intact   Screenings: AIMS    Flowsheet Row [facility]  AIMS Total Score 0      AUDIT    Flowsheet Row [facility]  Alcohol Use Disorder Identification Test Final Score (AUDIT) 0      Flowsheet Row ED from [facility]28/2023 in [facility]  C-SSRS RISK CATEGORY No Risk       Synopsis: - 23 year-old biracial female, 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, previously Archivist at [employer] with medical history significant of bronchial asthma and migraine, and psychiatric history significant of multiple previous psychiatric hospitalizations, previous suicide attempts and carries psychiatric diagnoses of major depressive disorder, anxiety disorders and bipolar disorder referred by her therapist for psychiatric medication management due to transportation problems because pt's last provider was at [facility] in [place] in 2019   Assessment and Plan:   - Pt's presentation is most consistent with generalized and social anxiety disorder, with recurrent depression. Mother had previously expressed concerns for bipolar disorder, but it seemed less likely in the absence of typical manic/hypomanic episodes which are consistent with  bipolar disorder.  - Pt also reports hx most likely consistent with OCD  Update on 07/25/23  - she returns to clinic after 1 month, had one ER visit as mentioned in HPI, tells  me that she did not have any SI and it was her mother who IVC'd her due to argument with her. She continues to have multiple psychosocial stressors, however currently working part time and now tells me that she has secured job in [place]  at a [employer] in [place] , says that she has verified the owner of [employer] by doing some online background searches. She appeared excited for the move and denied symptoms consistent with active episode of MDD, and anxiety appears stable. She was advised to establish OP psychiatrist, Gina Rogers is closes to where she will be and recommended to search  for psychiatrist there. She was given 90  days supply of her medications.  Plan: Plan reviewed on 07/25/23   # Mood, Anxiety (chronic, stable), OCD (chronic and stable) - Continue Prozac  60 mg daily.  - Self Discontinued Remeron  15 mg QHS - Continue Atarax  25 mg QAM PRN for anxiety, 50 mg QHS, and continue atarax  25 mg PRN at noon,  - Continue Propranolol  10 mg BID.   Insomnia: - can take Atarax  25-50 mg QHS PRN    This note was generated in part or whole with voice recognition software. Voice recognition is usually quite accurate but there are transcription errors that can and very often do occur. I apologize for any typographical errors that were not detected and corrected.        Pilar Bridge, MD 07/25/23  10:03 AM
